# Patient Record
Sex: Female | Born: 1980 | Race: White | Hispanic: Yes | Marital: Single | Smoking: Never smoker
Health system: Southern US, Community
[De-identification: ages and names within clinical notes are randomized; demographics above are authoritative.]

## PROBLEM LIST (undated history)

## (undated) ENCOUNTER — Inpatient Hospital Stay (HOSPITAL_COMMUNITY): Payer: Self-pay

## (undated) DIAGNOSIS — O24419 Gestational diabetes mellitus in pregnancy, unspecified control: Secondary | ICD-10-CM

## (undated) DIAGNOSIS — O021 Missed abortion: Secondary | ICD-10-CM

## (undated) DIAGNOSIS — D259 Leiomyoma of uterus, unspecified: Secondary | ICD-10-CM

## (undated) DIAGNOSIS — R51 Headache: Secondary | ICD-10-CM

## (undated) DIAGNOSIS — O3410 Maternal care for benign tumor of corpus uteri, unspecified trimester: Secondary | ICD-10-CM

## (undated) DIAGNOSIS — O368130 Decreased fetal movements, third trimester, not applicable or unspecified: Secondary | ICD-10-CM

## (undated) DIAGNOSIS — Z789 Other specified health status: Secondary | ICD-10-CM

## (undated) HISTORY — PX: LEEP: SHX91

## (undated) HISTORY — PX: MYOMECTOMY: SHX85

## (undated) HISTORY — PX: WISDOM TOOTH EXTRACTION: SHX21

---

## 2003-03-21 ENCOUNTER — Other Ambulatory Visit: Admission: RE | Admit: 2003-03-21 | Discharge: 2003-03-21 | Payer: Self-pay | Admitting: Obstetrics & Gynecology

## 2011-11-26 ENCOUNTER — Inpatient Hospital Stay (HOSPITAL_COMMUNITY)
Admission: AD | Admit: 2011-11-26 | Discharge: 2011-11-26 | Disposition: A | Discharge status: Home / Self Care | Payer: 59 | Source: Ambulatory Visit | Attending: Obstetrics and Gynecology | Admitting: Obstetrics and Gynecology

## 2011-11-26 ENCOUNTER — Encounter (HOSPITAL_COMMUNITY): Payer: Self-pay | Admitting: *Deleted

## 2011-11-26 DIAGNOSIS — D259 Leiomyoma of uterus, unspecified: Secondary | ICD-10-CM | POA: Diagnosis present

## 2011-11-26 DIAGNOSIS — O99891 Other specified diseases and conditions complicating pregnancy: Secondary | ICD-10-CM | POA: Insufficient documentation

## 2011-11-26 DIAGNOSIS — R109 Unspecified abdominal pain: Secondary | ICD-10-CM | POA: Diagnosis present

## 2011-11-26 HISTORY — DX: Leiomyoma of uterus, unspecified: O34.10

## 2011-11-26 HISTORY — DX: Maternal care for benign tumor of corpus uteri, unspecified trimester: D25.9

## 2011-11-26 HISTORY — DX: Other specified health status: Z78.9

## 2011-11-26 LAB — CBC
HCT: 33.1 % — ABNORMAL LOW (ref 36.0–46.0)
Hemoglobin: 11 g/dL — ABNORMAL LOW (ref 12.0–15.0)
MCH: 30.8 pg (ref 26.0–34.0)
MCHC: 33.2 g/dL (ref 30.0–36.0)
MCV: 92.7 fL (ref 78.0–100.0)
Platelets: 329 10*3/uL (ref 150–400)
RBC: 3.57 MIL/uL — ABNORMAL LOW (ref 3.87–5.11)
RDW: 13.3 % (ref 11.5–15.5)
WBC: 19.3 10*3/uL — ABNORMAL HIGH (ref 4.0–10.5)

## 2011-11-26 LAB — COMPREHENSIVE METABOLIC PANEL
ALT: 28 U/L (ref 0–35)
AST: 16 U/L (ref 0–37)
Albumin: 2.6 g/dL — ABNORMAL LOW (ref 3.5–5.2)
Alkaline Phosphatase: 127 U/L — ABNORMAL HIGH (ref 39–117)
BUN: 8 mg/dL (ref 6–23)
CO2: 26 mEq/L (ref 19–32)
Calcium: 9.2 mg/dL (ref 8.4–10.5)
Chloride: 98 mEq/L (ref 96–112)
Creatinine, Ser: 0.65 mg/dL (ref 0.50–1.10)
GFR calc Af Amer: >90 mL/min (ref >90)
GFR calc non Af Amer: >90 mL/min (ref >90)
Glucose, Bld: 120 mg/dL — ABNORMAL HIGH (ref 70–99)
Potassium: 3.9 mEq/L (ref 3.5–5.1)
Sodium: 136 mEq/L (ref 135–145)
Total Bilirubin: 0.8 mg/dL (ref 0.3–1.2)
Total Protein: 6.6 g/dL (ref 6.0–8.3)

## 2011-11-26 LAB — URINE MICROSCOPIC-ADD ON

## 2011-11-26 LAB — URINALYSIS, ROUTINE W REFLEX MICROSCOPIC
Bilirubin Urine: NEGATIVE
Glucose, UA: NEGATIVE mg/dL
Ketones, ur: NEGATIVE mg/dL
Nitrite: NEGATIVE
Protein, ur: NEGATIVE mg/dL
Specific Gravity, Urine: 1.015 (ref 1.005–1.030)
Urobilinogen, UA: 0.2 mg/dL (ref 0.0–1.0)
pH: 6.5 (ref 5.0–8.0)

## 2011-11-26 LAB — DIFFERENTIAL
Basophils Absolute: 0 10*3/uL (ref 0.0–0.1)
Basophils Relative: 0 % (ref 0–1)
Eosinophils Absolute: 0 10*3/uL (ref 0.0–0.7)
Eosinophils Relative: 0 % (ref 0–5)
Lymphocytes Relative: 6 % — ABNORMAL LOW (ref 12–46)
Lymphs Abs: 1.2 10*3/uL (ref 0.7–4.0)
Monocytes Absolute: 1 10*3/uL (ref 0.1–1.0)
Monocytes Relative: 5 % (ref 3–12)
Neutro Abs: 17.2 10*3/uL — ABNORMAL HIGH (ref 1.7–7.7)
Neutrophils Relative %: 89 % — ABNORMAL HIGH (ref 43–77)

## 2011-11-26 MED ORDER — TRAMADOL HCL 50 MG PO TABS: 50.0000 mg | ORAL_TABLET | Freq: Three times a day (TID) | ORAL | Status: AC | PRN

## 2011-11-26 MED ORDER — NALBUPHINE SYRINGE 5 MG/0.5 ML
10.0000 mg | INJECTION | INTRAMUSCULAR | Status: AC
  Administered 2011-11-26: 10 mg via SUBCUTANEOUS
  Filled 2011-11-26: qty 1

## 2011-11-26 NOTE — MAU Note (Signed)
Pt reports having lower abd on right and left side sharp pain that is not relieved with tylenol. Hurts to take a deep breath.

## 2011-11-26 NOTE — MAU Note (Signed)
Applied K-pad to affected area.

## 2011-11-26 NOTE — Progress Notes (Signed)
  VS: 97.3 - 109 - 18 - 125/74  CBC: 19.3 - 11.0 - 33.1- 329  CMP: SGOT 16 / SGPT 28 / glucose 120 / creatinine 0.65 / K 3.9  Urinalysis : 1015 spec gravity  / (+) leukocytes /  (-) nitrites / many bacteria - few epithelial  Pain improved with Nubain and k-pad to site. Tolerating aggressive oral hydration without nausea. Sitting up in bed eating sandwich - ready to go back home   A/P:  Abdominal pain likely related to degenerating fibroid Discussed fibroid pain may recur throughout pregnancy - pregnancy hormones contribute to fibroid growth Will monitor fibroid growth with sono in second and third trimester  Management plan:  Maintain adequate water hydration - instructed this is the single most important  Intervention to reduce fibroid pain in pregnancy Heating pad on low-moderate heat setting to fibroid location for 15-20 minutes each hour ok for comfort  Ok to continue tylenol as needed for pain Ultram for severe pain every 8 hours as needed - only take for severe pain & sparingly  Keep apt at [employer] on Monday

## 2011-11-26 NOTE — Discharge Instructions (Signed)
WATER hydration and fluids - hourly intake of oral fluids DAILY  ( to assess adequate oral hydration - maintain urine color as clear) Heat pad to right lower quadrant 15-20 minutes every hour as needed for comfort  Tylenol 2 tablets every 4 hours as needed for pain Ultram 50 mg every 8 hours for severe pain no relieved with Tylenol

## 2011-11-26 NOTE — MAU Provider Note (Signed)
  History    CSN: [id number]  Patient arrival date and time: 3/23/13at 1311  Nurse called provider: 11/26/2011 at 1354 per phone records  Provider arrived to see patient: 3/23/22013 at 1320    Chief Complaint  Patient presents with  . Abdominal Pain   HPI Comments: Intermittent abdominal pain x 2 weeks worsening in past 12 hours. Pain intense today - hurst so bad to move and breathe - tylenol ineffective for pain control.  Seen at [facility] this past week for same complaint / sono - IUP with FHR / degrenerating right pedunculated fibroid approximately 6cm size.  No bleeding or vaginal discharge.  + nausea with occasional vomiting in pregnancy / + nausea today / no vomiting. Normal bowel movement this am / normal appetite. Some constipation intermittently since pregnant. No fever or chills.  No urinary symptoms - no back or flank pain.    Past Medical History  Diagnosis Date  . Uterine fibroids affecting pregnancy   . No pertinent past medical history    Past Surgical History  Procedure Date  . Leep   . No past surgeries    No family history on file.  History  Substance Use Topics  . Smoking status: Never Smoker   . Smokeless tobacco: Not on file  . Alcohol Use: No   Allergies: No Known Allergies  Prescriptions prior to admission  Medication Sig Dispense Refill  . acetaminophen (TYLENOL) 325 MG tablet Take 650 mg by mouth every 6 (six) hours as needed. For pain      . Prenatal Vit-Fe Fumarate-FA (PRENATAL MULTIVITAMIN) TABS Take 1 tablet by mouth daily.      Last medication dose - 0800 this am  Review of Systems  Constitutional: Positive for malaise/fatigue. Negative for fever and chills.  Respiratory: Negative.   Cardiovascular: Negative.   Gastrointestinal: Positive for nausea, abdominal pain and constipation. Negative for diarrhea.  Genitourinary: Negative for dysuria, urgency, frequency, hematuria and flank pain.  Musculoskeletal: Negative.   Neurological:  Negative.  Negative for headaches.   Physical Exam  Vital Signs Blood pressure 125/74, pulse 109, temperature 97.3 F (36.3 C), temperature source Oral, resp. rate 18, height 5\' 7"  (1.702 m), weight 84.097 kg (185 lb 6.4 oz), last menstrual period 09/27/2011.  Physical Exam  Constitutional: She appears well-developed and well-nourished. She appears distressed.  Neck: Normal range of motion.  Cardiovascular: Normal rate and regular rhythm.   Respiratory: Effort normal and breath sounds normal.  GI: Soft. Bowel sounds are normal. She exhibits no distension. There is tenderness in the right lower quadrant and suprapubic area. There is no rigidity, no rebound, no guarding, no CVA tenderness and no tenderness at McBurney's point.  Genitourinary: Vagina normal. There is no rash, tenderness or lesion on the right labia. There is no rash, tenderness or lesion on the left labia.  Bimanual exam : no vaginal bleeding or discharge / no external lesions / no bladder or suprapubic tenderness / uterus slightly enlarged with mild tenderness / negative CMT / left adnexa normal / right adnexal region exquisitely tender on exam with firm mass~ 4-5 cm size and mobile.  MAU Course  Procedures - none  Assessment and Plan  7 week IUP (documented with SONO this week) Abdominal pain -likely related to large pedunculated, degenerating fibroid  1) Assess CBCD / CMP 2) aggressive oral hydration in next hour  3) pain management : k-pad to fibroid site / sub-q Nubain 10 mg now  [name] 11/26/2011, 2:45 PM

## 2011-12-05 DIAGNOSIS — O021 Missed abortion: Secondary | ICD-10-CM

## 2011-12-05 HISTORY — DX: Missed abortion: O02.1

## 2012-04-04 ENCOUNTER — Encounter (HOSPITAL_COMMUNITY): Payer: Self-pay | Admitting: Pharmacist

## 2012-04-09 ENCOUNTER — Other Ambulatory Visit: Payer: Self-pay | Admitting: Obstetrics & Gynecology

## 2012-04-11 ENCOUNTER — Encounter (HOSPITAL_COMMUNITY): Payer: Self-pay

## 2012-04-11 ENCOUNTER — Other Ambulatory Visit (HOSPITAL_COMMUNITY): Payer: 59

## 2012-04-11 ENCOUNTER — Encounter (HOSPITAL_COMMUNITY)
Admission: RE | Admit: 2012-04-11 | Discharge: 2012-04-11 | Disposition: A | Discharge status: Home / Self Care | Payer: 59 | Source: Ambulatory Visit | Attending: Obstetrics & Gynecology | Admitting: Obstetrics & Gynecology

## 2012-04-11 LAB — CBC
HCT: 43.3 % (ref 36.0–46.0)
MCH: 30.5 pg (ref 26.0–34.0)
MCHC: 33.7 g/dL (ref 30.0–36.0)
MCV: 90.6 fL (ref 78.0–100.0)
RDW: 14 % (ref 11.5–15.5)
WBC: 8.3 10*3/uL (ref 4.0–10.5)

## 2012-04-11 NOTE — Patient Instructions (Addendum)
20 Kimberly Campbell  04/11/2012   Your procedure is scheduled on:  04/16/12  Enter through the Main Entrance of [facility] at 1130 AM.  Pick up the phone at the desk and dial [phone].   Call this number if you have problems the morning of surgery: [phone]   Remember:   Do not eat food:after midnight the evening before surgery   (follow bowel prep instructions from MD).  Do not drink clear liquids: 4 Hours before arrival.  Take these medicines the morning of surgery with A SIP OF WATER: NA   Do not wear jewelry, make-up or nail polish.  Do not wear lotions, powders, or perfumes. You may wear deodorant.  Do not shave 48 hours prior to surgery.  Do not bring valuables to the hospital.  Contacts, dentures or bridgework may not be worn into surgery.  Leave suitcase in the car. After surgery it may be brought to your room.  For patients admitted to the hospital, checkout time is 11:00 AM the day of discharge.   Patients discharged the day of surgery will not be allowed to drive home.  Name and phone number of your driver: NA  Special Instructions: CHG Shower Use Special Wash: 1/2 bottle night before surgery and 1/2 bottle morning of surgery.   Please read over the following fact sheets that you were given: MRSA Information

## 2012-04-15 MED ORDER — DEXTROSE 5 % IV SOLN
2.0000 g | Freq: Once | INTRAVENOUS | Status: DC
  Filled 2012-04-15: qty 2

## 2012-04-16 ENCOUNTER — Encounter (HOSPITAL_COMMUNITY)
Admission: RE | Disposition: A | Discharge status: Home / Self Care | Payer: Self-pay | Source: Ambulatory Visit | Attending: Obstetrics & Gynecology

## 2012-04-16 ENCOUNTER — Encounter (HOSPITAL_COMMUNITY): Payer: Self-pay | Admitting: *Deleted

## 2012-04-16 ENCOUNTER — Ambulatory Visit (HOSPITAL_COMMUNITY)
Admission: RE | Admit: 2012-04-16 | Discharge: 2012-04-16 | Disposition: A | Discharge status: Home / Self Care | Payer: 59 | Source: Ambulatory Visit | Attending: Obstetrics & Gynecology | Admitting: Obstetrics & Gynecology

## 2012-04-16 DIAGNOSIS — Z01812 Encounter for preprocedural laboratory examination: Secondary | ICD-10-CM | POA: Insufficient documentation

## 2012-04-16 DIAGNOSIS — Z01818 Encounter for other preprocedural examination: Secondary | ICD-10-CM | POA: Insufficient documentation

## 2012-04-16 DIAGNOSIS — N736 Female pelvic peritoneal adhesions (postinfective): Secondary | ICD-10-CM | POA: Insufficient documentation

## 2012-04-16 DIAGNOSIS — D251 Intramural leiomyoma of uterus: Secondary | ICD-10-CM | POA: Insufficient documentation

## 2012-04-16 HISTORY — DX: Missed abortion: O02.1

## 2012-04-16 HISTORY — DX: Headache: R51

## 2012-04-16 LAB — TYPE AND SCREEN: ABO/RH(D): A POS

## 2012-04-16 LAB — ABO/RH: ABO/RH(D): A POS

## 2012-04-16 SURGERY — ROBOTIC ASSISTED MYOMECTOMY
Anesthesia: General

## 2012-04-16 MED ORDER — LACTATED RINGERS IV SOLN: INTRAVENOUS | Status: DC

## 2012-04-16 SURGICAL SUPPLY — 55 items
BARRIER ADHS 3X4 INTERCEED (GAUZE/BANDAGES/DRESSINGS) IMPLANT
BLADE LAP MORCELLATOR 15X9.5 (ELECTROSURGICAL) IMPLANT
CLOTH BEACON ORANGE TIMEOUT ST (SAFETY) IMPLANT
DECANTER SPIKE VIAL GLASS SM (MISCELLANEOUS) IMPLANT
DERMABOND ADVANCED (GAUZE/BANDAGES/DRESSINGS)
DERMABOND ADVANCED .7 DNX12 (GAUZE/BANDAGES/DRESSINGS) IMPLANT
DRAPE HUG U DISPOSABLE (DRAPE) IMPLANT
DRAPE LG THREE QUARTER DISP (DRAPES) IMPLANT
DRAPE MONITOR DA VINCI (DRAPE) IMPLANT
DRAPE WARM FLUID 44X44 (DRAPE) IMPLANT
ELECT REM PT RETURN 9FT ADLT (ELECTROSURGICAL)
ELECTRODE REM PT RTRN 9FT ADLT (ELECTROSURGICAL) IMPLANT
EVACUATOR SMOKE 8.L (FILTER) IMPLANT
GAUZE VASELINE 3X9 (GAUZE/BANDAGES/DRESSINGS) IMPLANT
GLOVE BIO SURGEON STRL SZ 6.5 (GLOVE) ×2 IMPLANT
GLOVE BIOGEL PI IND STRL 7.0 (GLOVE) ×1 IMPLANT
GLOVE BIOGEL PI INDICATOR 7.0 (GLOVE) ×1
GLOVE ECLIPSE 6.5 STRL STRAW (GLOVE) IMPLANT
GOWN STRL REIN XL XLG (GOWN DISPOSABLE) IMPLANT
IV SET ADMIN PUMP GEMINI W/NLD (IV SETS) IMPLANT
IV STOPCOCK 4 WAY 40  W/Y SET (IV SOLUTION)
IV STOPCOCK 4 WAY 40 W/Y SET (IV SOLUTION) IMPLANT
KIT ACCESSORY DA VINCI DISP (KITS)
KIT ACCESSORY DVNC DISP (KITS) IMPLANT
KIT DISP ACCESSORY 4 ARM (KITS) IMPLANT
NEEDLE HYPO 22GX1.5 SAFETY (NEEDLE) IMPLANT
OCCLUDER COLPOPNEUMO (BALLOONS) IMPLANT
PACK LAVH (CUSTOM PROCEDURE TRAY) IMPLANT
SET CYSTO W/LG BORE CLAMP LF (SET/KITS/TRAYS/PACK) IMPLANT
SET IRRIG TUBING LAPAROSCOPIC (IRRIGATION / IRRIGATOR) IMPLANT
SOLUTION ELECTROLUBE (MISCELLANEOUS) IMPLANT
SPONGE LAP 18X18 X RAY DECT (DISPOSABLE) IMPLANT
SUT VIC AB 0 CT1 27 (SUTURE)
SUT VIC AB 0 CT1 27XBRD ANTBC (SUTURE) IMPLANT
SUT VIC AB 4-0 PS2 27 (SUTURE) IMPLANT
SUT VICRYL 0 UR6 27IN ABS (SUTURE) IMPLANT
SUT VLOC 180 0 9IN  GS21 (SUTURE)
SUT VLOC 180 0 9IN GS21 (SUTURE) IMPLANT
SUT VLOC 180 2-0 9IN GS21 (SUTURE) IMPLANT
SYR 50ML LL SCALE MARK (SYRINGE) IMPLANT
SYSTEM CONVERTIBLE TROCAR (TROCAR) IMPLANT
TIP UTERINE 5.1X6CM LAV DISP (MISCELLANEOUS) IMPLANT
TIP UTERINE 6.7X10CM GRN DISP (MISCELLANEOUS) IMPLANT
TIP UTERINE 6.7X6CM WHT DISP (MISCELLANEOUS) IMPLANT
TIP UTERINE 6.7X8CM BLUE DISP (MISCELLANEOUS) IMPLANT
TOWEL OR 17X24 6PK STRL BLUE (TOWEL DISPOSABLE) IMPLANT
TRAY FOLEY BAG SILVER LF 14FR (CATHETERS) IMPLANT
TROCAR 12M 150ML BLUNT (TROCAR) IMPLANT
TROCAR DISP BLADELESS 8 DVNC (TROCAR) IMPLANT
TROCAR DISP BLADELESS 8MM (TROCAR)
TROCAR XCEL 12X100 BLDLESS (ENDOMECHANICALS) IMPLANT
TROCAR XCEL NON-BLD 5MMX100MML (ENDOMECHANICALS) IMPLANT
TROCAR Z-THREAD BLADED 12X100M (TROCAR) IMPLANT
TUBING FILTER THERMOFLATOR (ELECTROSURGICAL) IMPLANT
WATER STERILE IRR 1000ML POUR (IV SOLUTION) IMPLANT

## 2012-04-16 NOTE — Progress Notes (Signed)
Dr Seymour Bars Cancelled case

## 2012-04-16 NOTE — Progress Notes (Signed)
Recvd call from Ledon Snare, RN - OR Desk that there is going to be a room delay until 3pm.  Dr Seymour Bars was informed by Waynetta Sandy.  Patient informed.  Beth called back and said Dr Seymour Bars is coming to speak to the patient in SS.  Patient's surgery was scheduled for 1pm today.

## 2012-04-17 ENCOUNTER — Encounter (HOSPITAL_COMMUNITY): Payer: Self-pay | Admitting: Obstetrics and Gynecology

## 2012-04-17 ENCOUNTER — Encounter (HOSPITAL_COMMUNITY): Payer: Self-pay | Admitting: Anesthesiology

## 2012-04-17 ENCOUNTER — Encounter (HOSPITAL_COMMUNITY): Payer: Self-pay | Admitting: Obstetrics & Gynecology

## 2012-04-17 ENCOUNTER — Encounter (HOSPITAL_COMMUNITY)
Admission: RE | Disposition: A | Discharge status: Home / Self Care | Payer: Self-pay | Source: Ambulatory Visit | Attending: Obstetrics & Gynecology

## 2012-04-17 ENCOUNTER — Ambulatory Visit (HOSPITAL_COMMUNITY): Payer: 59 | Admitting: Anesthesiology

## 2012-04-17 ENCOUNTER — Ambulatory Visit (HOSPITAL_COMMUNITY)
Admission: RE | Admit: 2012-04-17 | Discharge: 2012-04-18 | Disposition: A | Discharge status: Home / Self Care | Payer: 59 | Source: Ambulatory Visit | Attending: Obstetrics & Gynecology | Admitting: Obstetrics & Gynecology

## 2012-04-17 DIAGNOSIS — D252 Subserosal leiomyoma of uterus: Secondary | ICD-10-CM | POA: Insufficient documentation

## 2012-04-17 DIAGNOSIS — Z01812 Encounter for preprocedural laboratory examination: Secondary | ICD-10-CM | POA: Insufficient documentation

## 2012-04-17 DIAGNOSIS — Z01818 Encounter for other preprocedural examination: Secondary | ICD-10-CM | POA: Insufficient documentation

## 2012-04-17 DIAGNOSIS — D251 Intramural leiomyoma of uterus: Secondary | ICD-10-CM | POA: Insufficient documentation

## 2012-04-17 DIAGNOSIS — N949 Unspecified condition associated with female genital organs and menstrual cycle: Secondary | ICD-10-CM | POA: Insufficient documentation

## 2012-04-17 DIAGNOSIS — N736 Female pelvic peritoneal adhesions (postinfective): Secondary | ICD-10-CM | POA: Insufficient documentation

## 2012-04-17 HISTORY — PX: ROBOT ASSISTED MYOMECTOMY: SHX5142

## 2012-04-17 SURGERY — ROBOTIC ASSISTED MYOMECTOMY
Anesthesia: General | Site: Abdomen | Wound class: Clean Contaminated

## 2012-04-17 MED ORDER — BUPIVACAINE HCL (PF) 0.25 % IJ SOLN
INTRAMUSCULAR | Status: AC
  Filled 2012-04-17: qty 30

## 2012-04-17 MED ORDER — IBUPROFEN 600 MG PO TABS
600.0000 mg | ORAL_TABLET | Freq: Four times a day (QID) | ORAL | Status: DC | PRN
  Administered 2012-04-18: 600 mg via ORAL
  Filled 2012-04-17: qty 1

## 2012-04-17 MED ORDER — ROCURONIUM BROMIDE 100 MG/10ML IV SOLN
INTRAVENOUS | Status: DC | PRN
  Administered 2012-04-17: 5 mg via INTRAVENOUS
  Administered 2012-04-17 (×2): 10 mg via INTRAVENOUS
  Administered 2012-04-17: 50 mg via INTRAVENOUS

## 2012-04-17 MED ORDER — GLYCOPYRROLATE 0.2 MG/ML IJ SOLN
INTRAMUSCULAR | Status: DC | PRN
  Administered 2012-04-17: .8 mg via INTRAVENOUS

## 2012-04-17 MED ORDER — MIDAZOLAM HCL 5 MG/5ML IJ SOLN
INTRAMUSCULAR | Status: DC | PRN
  Administered 2012-04-17: 2 mg via INTRAVENOUS

## 2012-04-17 MED ORDER — DEXAMETHASONE SODIUM PHOSPHATE 4 MG/ML IJ SOLN
INTRAMUSCULAR | Status: DC | PRN
  Administered 2012-04-17: 10 mg via INTRAVENOUS

## 2012-04-17 MED ORDER — ONDANSETRON HCL 4 MG/2ML IJ SOLN
INTRAMUSCULAR | Status: AC
  Filled 2012-04-17: qty 2

## 2012-04-17 MED ORDER — ROCURONIUM BROMIDE 50 MG/5ML IV SOLN
INTRAVENOUS | Status: AC
  Filled 2012-04-17: qty 2

## 2012-04-17 MED ORDER — LIDOCAINE HCL (CARDIAC) 20 MG/ML IV SOLN
INTRAVENOUS | Status: DC | PRN
  Administered 2012-04-17: 80 mg via INTRAVENOUS

## 2012-04-17 MED ORDER — PROPOFOL 10 MG/ML IV EMUL
INTRAVENOUS | Status: DC | PRN
  Administered 2012-04-17: 200 mg via INTRAVENOUS

## 2012-04-17 MED ORDER — VASOPRESSIN 20 UNIT/ML IJ SOLN
INTRAMUSCULAR | Status: AC
  Filled 2012-04-17: qty 2

## 2012-04-17 MED ORDER — BUPIVACAINE HCL (PF) 0.25 % IJ SOLN
INTRAMUSCULAR | Status: DC | PRN
  Administered 2012-04-17: 8 mL
  Administered 2012-04-17: 14 mL

## 2012-04-17 MED ORDER — DEXAMETHASONE SODIUM PHOSPHATE 10 MG/ML IJ SOLN
INTRAMUSCULAR | Status: AC
  Filled 2012-04-17: qty 1

## 2012-04-17 MED ORDER — FENTANYL CITRATE 0.05 MG/ML IJ SOLN
INTRAMUSCULAR | Status: DC | PRN
  Administered 2012-04-17: 75 ug via INTRAVENOUS
  Administered 2012-04-17: 50 ug via INTRAVENOUS
  Administered 2012-04-17: 25 ug via INTRAVENOUS
  Administered 2012-04-17: 100 ug via INTRAVENOUS
  Administered 2012-04-17 (×2): 50 ug via INTRAVENOUS

## 2012-04-17 MED ORDER — HYDROMORPHONE HCL PF 1 MG/ML IJ SOLN: 1.0000 mg | INTRAMUSCULAR | Status: DC | PRN

## 2012-04-17 MED ORDER — LACTATED RINGERS IV SOLN: INTRAVENOUS | Status: DC

## 2012-04-17 MED ORDER — OXYCODONE-ACETAMINOPHEN 5-325 MG PO TABS: 1.0000 | ORAL_TABLET | ORAL | Status: DC | PRN

## 2012-04-17 MED ORDER — NEOSTIGMINE METHYLSULFATE 1 MG/ML IJ SOLN
INTRAMUSCULAR | Status: AC
  Filled 2012-04-17: qty 10

## 2012-04-17 MED ORDER — ONDANSETRON HCL 4 MG/2ML IJ SOLN
INTRAMUSCULAR | Status: DC | PRN
  Administered 2012-04-17: 4 mg via INTRAVENOUS

## 2012-04-17 MED ORDER — LIDOCAINE HCL (CARDIAC) 20 MG/ML IV SOLN
INTRAVENOUS | Status: AC
  Filled 2012-04-17: qty 5

## 2012-04-17 MED ORDER — GLYCOPYRROLATE 0.2 MG/ML IJ SOLN
INTRAMUSCULAR | Status: AC
  Filled 2012-04-17: qty 2

## 2012-04-17 MED ORDER — ARTIFICIAL TEARS OP OINT
TOPICAL_OINTMENT | OPHTHALMIC | Status: AC
  Filled 2012-04-17: qty 3.5

## 2012-04-17 MED ORDER — LACTATED RINGERS IV SOLN
INTRAVENOUS | Status: DC
  Administered 2012-04-17: 15:00:00 via INTRAVENOUS

## 2012-04-17 MED ORDER — DEXTROSE 5 % IV SOLN
2.0000 g | Freq: Once | INTRAVENOUS | Status: AC
  Administered 2012-04-17: 2 g via INTRAVENOUS
  Filled 2012-04-17: qty 2

## 2012-04-17 MED ORDER — HYDROMORPHONE HCL PF 1 MG/ML IJ SOLN
0.2500 mg | INTRAMUSCULAR | Status: DC | PRN
  Administered 2012-04-17: 0.5 mg via INTRAVENOUS

## 2012-04-17 MED ORDER — HYDROMORPHONE HCL PF 1 MG/ML IJ SOLN
INTRAMUSCULAR | Status: AC
  Administered 2012-04-17: 0.5 mg via INTRAVENOUS
  Filled 2012-04-17: qty 1

## 2012-04-17 MED ORDER — MIDAZOLAM HCL 2 MG/2ML IJ SOLN
INTRAMUSCULAR | Status: AC
  Filled 2012-04-17: qty 2

## 2012-04-17 MED ORDER — NEOSTIGMINE METHYLSULFATE 1 MG/ML IJ SOLN
INTRAMUSCULAR | Status: DC | PRN
  Administered 2012-04-17: 4 mg via INTRAVENOUS

## 2012-04-17 MED ORDER — FENTANYL CITRATE 0.05 MG/ML IJ SOLN
INTRAMUSCULAR | Status: AC
  Filled 2012-04-17: qty 5

## 2012-04-17 MED ORDER — PROPOFOL 10 MG/ML IV EMUL
INTRAVENOUS | Status: AC
  Filled 2012-04-17: qty 20

## 2012-04-17 MED ORDER — FENTANYL CITRATE 0.05 MG/ML IJ SOLN
INTRAMUSCULAR | Status: AC
  Filled 2012-04-17: qty 2

## 2012-04-17 SURGICAL SUPPLY — 56 items
BARRIER ADHS 3X4 INTERCEED (GAUZE/BANDAGES/DRESSINGS) ×2 IMPLANT
BLADE LAP MORCELLATOR 15X9.5 (ELECTROSURGICAL) IMPLANT
CLOTH BEACON ORANGE TIMEOUT ST (SAFETY) ×2 IMPLANT
DECANTER SPIKE VIAL GLASS SM (MISCELLANEOUS) ×2 IMPLANT
DERMABOND ADVANCED (GAUZE/BANDAGES/DRESSINGS) ×1
DERMABOND ADVANCED .7 DNX12 (GAUZE/BANDAGES/DRESSINGS) ×1 IMPLANT
DRAPE HUG U DISPOSABLE (DRAPE) ×2 IMPLANT
DRAPE LG THREE QUARTER DISP (DRAPES) ×4 IMPLANT
DRAPE MONITOR DA VINCI (DRAPE) IMPLANT
DRAPE WARM FLUID 44X44 (DRAPE) ×2 IMPLANT
ELECT REM PT RETURN 9FT ADLT (ELECTROSURGICAL) ×2
ELECTRODE REM PT RTRN 9FT ADLT (ELECTROSURGICAL) ×1 IMPLANT
EVACUATOR SMOKE 8.L (FILTER) ×2 IMPLANT
GAUZE VASELINE 3X9 (GAUZE/BANDAGES/DRESSINGS) IMPLANT
GLOVE BIO SURGEON STRL SZ 6.5 (GLOVE) ×4 IMPLANT
GLOVE BIOGEL PI IND STRL 7.0 (GLOVE) ×1 IMPLANT
GLOVE BIOGEL PI INDICATOR 7.0 (GLOVE) ×1
GLOVE ECLIPSE 6.5 STRL STRAW (GLOVE) ×6 IMPLANT
GOWN STRL REIN XL XLG (GOWN DISPOSABLE) ×12 IMPLANT
IV SET ADMIN PUMP GEMINI W/NLD (IV SETS) IMPLANT
IV STOPCOCK 4 WAY 40  W/Y SET (IV SOLUTION) ×1
IV STOPCOCK 4 WAY 40 W/Y SET (IV SOLUTION) ×1 IMPLANT
KIT ACCESSORY DA VINCI DISP (KITS) ×1
KIT ACCESSORY DVNC DISP (KITS) ×1 IMPLANT
KIT DISP ACCESSORY 4 ARM (KITS) IMPLANT
NEEDLE HYPO 22GX1.5 SAFETY (NEEDLE) ×2 IMPLANT
OCCLUDER COLPOPNEUMO (BALLOONS) IMPLANT
PACK LAVH (CUSTOM PROCEDURE TRAY) ×2 IMPLANT
SET CYSTO W/LG BORE CLAMP LF (SET/KITS/TRAYS/PACK) IMPLANT
SET IRRIG TUBING LAPAROSCOPIC (IRRIGATION / IRRIGATOR) ×2 IMPLANT
SOLUTION ELECTROLUBE (MISCELLANEOUS) ×2 IMPLANT
SPONGE LAP 18X18 X RAY DECT (DISPOSABLE) IMPLANT
SUT VIC AB 0 CT1 27 (SUTURE)
SUT VIC AB 0 CT1 27XBRD ANTBC (SUTURE) IMPLANT
SUT VIC AB 4-0 PS2 27 (SUTURE) IMPLANT
SUT VICRYL 0 UR6 27IN ABS (SUTURE) ×4 IMPLANT
SUT VLOC 180 0 9IN  GS21 (SUTURE)
SUT VLOC 180 0 9IN GS21 (SUTURE) IMPLANT
SUT VLOC 180 2-0 6IN GS21 (SUTURE) ×2 IMPLANT
SUT VLOC 180 2-0 9IN GS21 (SUTURE) IMPLANT
SYR 50ML LL SCALE MARK (SYRINGE) ×2 IMPLANT
SYSTEM CONVERTIBLE TROCAR (TROCAR) ×2 IMPLANT
TIP UTERINE 5.1X6CM LAV DISP (MISCELLANEOUS) IMPLANT
TIP UTERINE 6.7X10CM GRN DISP (MISCELLANEOUS) IMPLANT
TIP UTERINE 6.7X6CM WHT DISP (MISCELLANEOUS) IMPLANT
TIP UTERINE 6.7X8CM BLUE DISP (MISCELLANEOUS) IMPLANT
TOWEL OR 17X24 6PK STRL BLUE (TOWEL DISPOSABLE) ×4 IMPLANT
TRAY FOLEY BAG SILVER LF 14FR (CATHETERS) ×2 IMPLANT
TROCAR 12M 150ML BLUNT (TROCAR) IMPLANT
TROCAR DISP BLADELESS 8 DVNC (TROCAR) ×1 IMPLANT
TROCAR DISP BLADELESS 8MM (TROCAR) ×1
TROCAR XCEL 12X100 BLDLESS (ENDOMECHANICALS) ×2 IMPLANT
TROCAR XCEL NON-BLD 5MMX100MML (ENDOMECHANICALS) ×2 IMPLANT
TROCAR Z-THREAD BLADED 12X100M (TROCAR) IMPLANT
TUBING FILTER THERMOFLATOR (ELECTROSURGICAL) ×2 IMPLANT
WATER STERILE IRR 1000ML POUR (IV SOLUTION) ×6 IMPLANT

## 2012-04-17 NOTE — Anesthesia Postprocedure Evaluation (Signed)
Anesthesia Post Note  Patient: Kimberly Campbell  Procedure(s) Performed: Procedure(s) (LRB): ROBOTIC ASSISTED MYOMECTOMY (N/A) LYSIS OF ADHESION (N/A)  Anesthesia type: GA  Patient location: PACU  Post pain: Pain level controlled  Post assessment: Post-op Vital signs reviewed  Last Vitals:  Filed Vitals:   04/17/12 2000  BP: 135/81  Pulse: 82  Temp:   Resp: 19    Post vital signs: Reviewed  Level of consciousness: sedated  Complications: No apparent anesthesia complications

## 2012-04-17 NOTE — Anesthesia Procedure Notes (Addendum)
Procedure Name: Intubation Date/Time: 04/17/2012 4:16 PM Performed by: Rosalia Hammers Pre-anesthesia Checklist: Patient identified, Patient being monitored, Emergency Drugs available, Timeout performed and Suction available Patient Re-evaluated:Patient Re-evaluated prior to inductionOxygen Delivery Method: Circle system utilized Preoxygenation: Pre-oxygenation with 100% oxygen Intubation Type: IV induction Ventilation: Mask ventilation without difficulty and Oral airway inserted - appropriate to patient size Laryngoscope Size: Mac and 3 Grade View: Grade II Tube type: Oral Tube size: 7.5 mm Number of attempts: 1 Airway Equipment and Method: Stylet Placement Confirmation: ETT inserted through vocal cords under direct vision,  breath sounds checked- equal and bilateral and positive ETCO2 Secured at: 19 cm Tube secured with: Tape Dental Injury: Teeth and Oropharynx as per pre-operative assessment

## 2012-04-17 NOTE — Transfer of Care (Signed)
Immediate Anesthesia Transfer of Care Note  Patient: Kimberly Campbell  Procedure(s) Performed: Procedure(s) (LRB): ROBOTIC ASSISTED MYOMECTOMY (N/A) LYSIS OF ADHESION (N/A)  Patient Location: PACU  Anesthesia Type: General  Level of Consciousness: awake  Airway & Oxygen Therapy: Patient Spontanous Breathing and Patient connected to nasal cannula oxygen  Post-op Assessment: Report given to PACU RN and Post -op Vital signs reviewed and stable  Post vital signs: stable  Complications: No apparent anesthesia complications

## 2012-04-17 NOTE — Anesthesia Preprocedure Evaluation (Signed)

## 2012-04-17 NOTE — Op Note (Signed)
04/17/2012  6:39 PM  PATIENT:  Kimberly Campbell  31 y.o. female  PRE-OPERATIVE DIAGNOSIS:  Uterine Myomas  POST-OPERATIVE DIAGNOSIS:  Uterine Myomas and severe pelvic adhesions  PROCEDURE:  Procedure(s): ROBOTIC ASSISTED MYOMECTOMY AND EXTENSIVE LYSIS OF ADHESION  SURGEON:  Surgeon(s): Genia Del, MD  ASSISTANTS: Arlan Organ   ANESTHESIA:   general  PROCEDURE:  Under general and anesthesia with endotracheal intubation.  The patient is in lithotomy position. She is prepped with ChloraPrep on the abdomen and with Betadine on the suprapubic, vulvar and vaginal areas.  She is draped as usual. The vaginal exam reveals an irregular uterus about 10 cm, no adnexal mass felt.  The weighted speculum is inserted in the vagina and the cervix is grasped with a tenaculum. The hysterometry is 8 cm. Dilation of the cervix up to Hegar #21 without difficulty.  A #8 roomy with a small Coe ring are inserted without difficulty. The tenaculum and weighted speculum were removed. The Foley is inserted in the bladder.  A supraumbilical incision is made with a scalpel over 1.2 cm after infiltrating with Marcaine.  The aponeurosis is opened with the Mayo scissors under direct vision. The peritoneum is opened bluntly with a finger. A pursestring stitch of Vicryl 0 is done on the aponeurosis. The Roseanne Reno is inserted under direct vision. A pneumoperitoneum is created with CO2. The camera is inserted at that level. Inspection of the abdominopelvic cavities reveal a normal abdomen, normal appendix.  The pelvic cavity on presents severe widespread adhesions. Fine adhesions are present throughout the anterior and posterior cul-de-sac.  Thicker adhesions are present around the necrotic pedunculated anterior right fibroid.  The omentum is attached to that fibroid and the fibroid is in turn attached to the right anterior pelvic wall.  Port placement are disposed in a Teacher, adult education. 2 robotic ports on the right, 1  robotic port on the lower left and the 5 mm assistant port on the upper left.  A small incision is done with a scalpel after infiltration of Marcaine and all ports are placed under direct vision.  The robot his docked from the right side. The Endo Shears scissor is inserted in the right hand, the PK in the left hand and the pro-grasp in the third arm.  We go to the we start the procedure with extensive lysis of adhesions. All the adhesions are lysed and the anterior and posterior cul-de-sac and also around the left and right adnexa a. After releasing all adhesions the anatomy is completely restored. Both tubes and both ovaries are normal to inspection. We then freed the thicker adhesions associated with the pedunculated fibroid.  A very small, <1 cm, subserosal myoma is present the and the midline at the lower posterior aspect of the uterus. That fibroid is easily removed using on the PK at the base.  It is removed in 2 pieces through the 5 mm trocar.  We then proceed with removal of the pedunculated fibroid. Given the wide base, we resect the serosa above the base of the fibroid with the point of the Endo Shears scissors.  Traction is done with the tenaculum used robotically in the third arm.  The serosa is closed with a V. LOC 2-0 in a running suture.  We then visualize an intramural myoma at the mid anterior aspect of the uterus. It measures about 2 cm in diameter. We infiltrate at that level with Pitressin in a concentration of 20 and 100.  10 cc were used. We then make  a vertical incision at that level with the point of the Endo Shears scissors.  The myoma as extracted with the tenaculum. We then closed that small incision with a V. Lock 0 in a running suture. Hemostasis is adequate. We irrigated and suctioned the abdominopelvic cavities abundantly. All robotic instruments are removed.  The robot is undocked. We then go to laparoscopy times.  Using an 8 mm camera, the needles are removed.  We then insert the  morcellator through the supraumbilical  Incision.  The smaller intramural myoma is removed from the cavity. We then morcellated the pedunculated myoma.  Irrigation and suction was conducted once more. And Interceed was applied on the anterior aspect of the uterus covering both incisions. Hemostasis was adequate at all levels. All instruments were removed. All trochars were removed under direct vision. The CO2 was evacuated. The pursestring stitch was attached at the supraumbilical incision. The Bovie was used to complete hemostasis on all incisions. A Vicryl 4-0 was used in a subcuticular stitch to close all incisions. Dermabond was added on all incisions the co-ring with the roomy were removed from the vagina. The Foley was removed as well. The patient was brought to recovery room in good and stable status.  ESTIMATED BLOOD LOSS:  100 cc   Intake/Output Summary (Last 24 hours) at 04/17/12 1839 Last data filed at 04/17/12 1715  Gross per 24 hour  Intake    500 ml  Output    100 ml  Net    400 ml     BLOOD ADMINISTERED:none   LOCAL MEDICATIONS USED:  MARCAINE     SPECIMEN:  Source of Specimen:  Uterine myomas (3)  DISPOSITION OF SPECIMEN:  PATHOLOGY  COUNTS:  YES  PLAN OF CARE: Transfer to PACU    Genia Del MD   04/17/2012 at 6:40 PM

## 2012-04-17 NOTE — H&P (Signed)
Kimberly Campbell is an 31 y.o. 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  Had to be on Narcotics.  Menstrual History:  Patient's last menstrual period was 03/30/2012.    Past Medical History  Diagnosis Date  . Uterine fibroids affecting pregnancy   . No pertinent past medical history   . Headache     last one - 05/2011  . Missed abortion 12/2011    no surgery required    Past Surgical History  Procedure Date  . Leep   . No past surgeries   . Wisdom tooth extraction   . Robot assisted myomectomy 04/16/2012    Procedure: ROBOTIC ASSISTED MYOMECTOMY;  Surgeon: Genia Del, MD;  Location: [facility];  Service: Gynecology;  Laterality: N/A;  3 hrs.  Canceled 04/16/2012 and rescheduled today   No family history on file.  Social History:  reports that she has never smoked. She has never used smokeless tobacco. She reports that she drinks alcohol. She reports that she does not use illicit drugs.  Allergies: No Known Allergies  Prescriptions prior to admission  Medication Sig Dispense Refill  . acetaminophen (TYLENOL) 325 MG tablet Take 325 mg by mouth every 6 (six) hours as needed. For pain         Blood pressure 132/91, pulse 92, temperature 99 F (37.2 C), temperature source Oral, resp. rate 15, last menstrual period 03/30/2012, SpO2 96.00%.  [place]  Necrotic pedunculated 6.8 cm myoma   No results found for this or any previous visit (from the past 24 hour(s)).  No results found.  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  Surgery and risks reviewed.  Kimberly Campbell,Kimberly Campbell 04/17/2012, 3:38 PM

## 2012-04-18 ENCOUNTER — Encounter (HOSPITAL_COMMUNITY): Payer: Self-pay | Admitting: Obstetrics & Gynecology

## 2012-04-18 LAB — CBC
MCH: 30.7 pg (ref 26.0–34.0)
MCHC: 33.8 g/dL (ref 30.0–36.0)
MCV: 90.9 fL (ref 78.0–100.0)
Platelets: 195 10*3/uL (ref 150–400)
RBC: 4.27 MIL/uL (ref 3.87–5.11)
RDW: 13.5 % (ref 11.5–15.5)

## 2012-04-18 MED ORDER — OXYCODONE-ACETAMINOPHEN 7.5-325 MG PO TABS: 1.0000 | ORAL_TABLET | ORAL | Status: AC | PRN

## 2012-04-18 NOTE — Anesthesia Postprocedure Evaluation (Signed)
  Anesthesia Post-op Note  Patient: Kimberly Campbell  Procedure(s) Performed: Procedure(s) (LRB): ROBOTIC ASSISTED MYOMECTOMY (N/A) LYSIS OF ADHESION (N/A)  Patient Location: Women[facility] Unit  Anesthesia Type: General  Level of Consciousness: awake, alert  and oriented  Airway and Oxygen Therapy: Patient Spontanous Breathing  Post-op Pain: none  Post-op Assessment: Post-op Vital signs reviewed and Patient's Cardiovascular Status Stable  Post-op Vital Signs: Reviewed and stable  Complications: No apparent anesthesia complications

## 2012-04-18 NOTE — Progress Notes (Signed)
1 Day Post-Op Procedure(s) (LRB): ROBOTIC ASSISTED MYOMECTOMY (N/A) LYSIS OF ADHESION (N/A)  Subjective: Patient reports that pain is well managed.  Tolerating normal diet as tolerated  diet without difficulty. No nausea / vomiting.  Ambulating and voiding.  Objective: BP 119/73  Pulse 88  Temp 98.1 F (36.7 C) (Oral)  Resp 18  Ht 5\' 7"  (1.702 m)  Wt 84.823 kg (187 lb)  BMI 29.29 kg/m2  SpO2 95%  LMP 03/30/2012 Lungs: clear Heart: normal rate and rhythm Abdomen:soft and appropriately tender Extremities: Homans sign is negative, no sign of DVT Incision: healing well  Assessment: s/p Procedure(s): ROBOTIC ASSISTED MYOMECTOMY LYSIS OF ADHESION: progressing well  Plan: Discharge home  LOS: 1 day    Kimberly Campbell,MARIE-LYNE 04/18/2012, 1:30 PM

## 2012-04-18 NOTE — Discharge Summary (Signed)
  Physician Discharge Summary  Patient ID: Kimberly Campbell MRN: [id number] DOB/AGE: [birth year] 30 y.o.  Admit date: 04/17/2012 Discharge date: 04/18/2012  Admission Diagnoses: uterine myomas  Discharge Diagnoses: uterine myomas        Active Problems:  * No active hospital problems. *    Discharged Condition: good  Hospital Course:   Consults: None  Treatments: surgery: Robotic myomectomies and lysis of adhesions  Disposition: 01-Home or Self Care   Medication List  As of 04/18/2012  1:45 PM   ASK your doctor about these medications         acetaminophen 325 MG tablet   Commonly known as: TYLENOL   Take 325 mg by mouth every 6 (six) hours as needed. For pain           Follow-up Information    Follow up with Kimberly Campbell,MARIE-LYNE, MD in 3 weeks.   Contact information:   [address] [place] [address] [phone]          SignedGenia Del, MD 04/18/2012, 1:45 PM

## 2012-04-18 NOTE — Plan of Care (Signed)
Problem: Phase II Progression Outcomes Goal: Remove staples if indicated/incision care Outcome: Completed/Met Date Met:  04/18/12 5 lap sites with dermabond

## 2012-04-18 NOTE — Addendum Note (Signed)
Addendum  created 04/18/12 1403 by Shanon Payor, CRNA   Modules edited:Notes Section

## 2012-04-18 NOTE — Progress Notes (Signed)
Pt discharged to home. Ambulated out with family member.  Teaching complete.

## 2013-06-30 ENCOUNTER — Inpatient Hospital Stay (HOSPITAL_COMMUNITY)
Admission: AD | Admit: 2013-06-30 | Discharge: 2013-06-30 | Disposition: A | Discharge status: Home / Self Care | Payer: Managed Care, Other (non HMO) | Source: Ambulatory Visit | Attending: Obstetrics & Gynecology | Admitting: Obstetrics & Gynecology

## 2013-06-30 ENCOUNTER — Inpatient Hospital Stay (HOSPITAL_COMMUNITY): Payer: Managed Care, Other (non HMO)

## 2013-06-30 ENCOUNTER — Encounter (HOSPITAL_COMMUNITY): Payer: Self-pay | Admitting: *Deleted

## 2013-06-30 DIAGNOSIS — O200 Threatened abortion: Secondary | ICD-10-CM | POA: Insufficient documentation

## 2013-06-30 LAB — PROGESTERONE: Progesterone: 12.5 ng/mL

## 2013-06-30 LAB — URINALYSIS, ROUTINE W REFLEX MICROSCOPIC
Bilirubin Urine: NEGATIVE
Ketones, ur: NEGATIVE mg/dL
Nitrite: NEGATIVE
Protein, ur: NEGATIVE mg/dL
pH: 6 (ref 5.0–8.0)

## 2013-06-30 LAB — URINE MICROSCOPIC-ADD ON

## 2013-06-30 NOTE — MAU Provider Note (Signed)
History     CSN: [id number]  Arrival date and time: 06/30/13 0946 Nurse call to provider @ 1030  Provider in for exam @ 1145 after ultrasound    Chief Complaint  Patient presents with  . Vaginal Bleeding   HPI  Onset pink spotting this am - saw pink in toilet No recent intercourse Ultrasound in office confirmed IUP Hx SAB 7 weeks with large fibroids - myomectomy after pregnancy loss  Past Medical History  Diagnosis Date  . Uterine fibroids affecting pregnancy   . No pertinent past medical history   . Headache(784.0)     last one - 05/2011  . Missed abortion 12/2011    no surgery required    Past Surgical History  Procedure Laterality Date  . Leep    . No past surgeries    . Wisdom tooth extraction    . Robot assisted myomectomy  04/16/2012    Procedure: ROBOTIC ASSISTED MYOMECTOMY;  Surgeon: Genia Del, MD;  Location: [facility];  Service: Gynecology;  Laterality: N/A;  3 hrs.  . Robot assisted myomectomy  04/17/2012    Procedure: ROBOTIC ASSISTED MYOMECTOMY;  Surgeon: Genia Del, MD;  Location: [facility];  Service: Gynecology;  Laterality: N/A;    History reviewed. No pertinent family history.  History  Substance Use Topics  . Smoking status: Never Smoker   . Smokeless tobacco: Never Used  . Alcohol Use: Yes     Comment: rarely    Allergies: No Known Allergies  Prescriptions prior to admission  Medication Sig Dispense Refill  . Prenatal Vit-Fe Fumarate-FA (PRENATAL MULTIVITAMIN) TABS tablet Take 1 tablet by mouth daily at 12 noon.        ROS Physical Exam   Blood pressure 132/82, pulse 96, temperature 98.9 F (37.2 C), temperature source Oral, resp. rate 18, height 5\' 7"  (1.702 m), weight 90.776 kg (200 lb 2 oz), last menstrual period 05/10/2013.  Physical Exam NAD or pain / alert and oriented Lungs - clear Heart RRR Abdomen soft and non-tender Genitalia - scant pink on peripad Spec exam - small dark brown to pink blood in vaginal  vault Cervix appears long and closed - no active bleeding from os Uterus non-tender / adnexa without pain or masses  MAU Course  Procedures  ultrasound results:  CLINICAL DATA: Early pregnancy with vaginal bleeding. Last  menstrual period is approximately 05/10/2013.   OBSTETRIC <14 WK [place] AND TRANSVAGINAL OB US  TECHNIQUE: Both transabdominal and transvaginal ultrasound examinations were  performed for complete evaluation of the gestation as well as the maternal uterus, adnexal regions, and pelvic cul-de-sac. Transvaginal technique was performed to assess early pregnancy.   COMPARISON: None.  FINDINGS:  Intrauterine gestational sac: Single intrauterine gestational sac visualized.  Small adjacent subchorionic hemorrhage visualized.  Yolk sac: Normal yolk sac visualized.  Embryo: Fetal pole visualized.  Cardiac Activity: Regular cardiac activity detected.  Heart Rate: 129 bpm  MSD: mm w d  CRL: 9.5 mm 7 w 0 d [place] EDC: 02/16/2014  Maternal uterus/adnexae: Both ovaries are visualized and unremarkable in appearance.  No adnexal masses are identified. No free fluid is identified.   IMPRESSION:  Single viable intrauterine gestation identified by ultrasound with  estimated gestational age of [redacted] weeks and 0 days. Ultrasound EDC is  02/16/2014. A small adjacent subchorionic hemorrhage is identified.     Assessment and Plan  7.[redacted] weeks pregnant with [facility] Threatened miscarriage  1) Check progesterone level - consider Prometrium 2)  Reassured FHR - small  [facility] - implantation site bleed  3) Supportive care - rest at home x 24 hours without exercise / intercourse / strenuous activity                            - maintain good water hydration                            - call if bright red bleeding or pain 4) keep next office visit as scheduled  [name] CNM, MSN, [facility] 06/30/2013, 10:35 AM

## 2013-06-30 NOTE — MAU Note (Signed)
States office U/S confirmed living IUP.

## 2013-06-30 NOTE — MAU Note (Signed)
Pt states began bleeding this am, is wearing panty liner, and has changed pad x2. 2 weeks ago saw small clot, told Dr. Seymour Bars, then had u/s which was wnl. Denies pain at present

## 2013-06-30 NOTE — MAU Provider Note (Signed)
Reviewed and agree with note and plan. V.Lizzie An, MD  

## 2013-07-02 LAB — URINE CULTURE: Colony Count: 70000

## 2013-07-11 LAB — OB RESULTS CONSOLE HEPATITIS B SURFACE ANTIGEN: Hepatitis B Surface Ag: NEGATIVE

## 2013-07-11 LAB — OB RESULTS CONSOLE HIV ANTIBODY (ROUTINE TESTING): HIV: NONREACTIVE

## 2013-07-11 LAB — OB RESULTS CONSOLE RPR: RPR: NONREACTIVE

## 2013-07-11 LAB — OB RESULTS CONSOLE RUBELLA ANTIBODY, IGM: Rubella: IMMUNE

## 2013-10-25 ENCOUNTER — Other Ambulatory Visit: Payer: Self-pay | Admitting: Obstetrics & Gynecology

## 2013-10-25 ENCOUNTER — Ambulatory Visit (HOSPITAL_COMMUNITY)
Admission: RE | Admit: 2013-10-25 | Discharge: 2013-10-25 | Disposition: A | Discharge status: Home / Self Care | Payer: PRIVATE HEALTH INSURANCE | Source: Ambulatory Visit | Attending: Obstetrics & Gynecology | Admitting: Obstetrics & Gynecology

## 2013-10-25 ENCOUNTER — Other Ambulatory Visit (HOSPITAL_COMMUNITY): Payer: Self-pay | Admitting: Obstetrics & Gynecology

## 2013-10-25 DIAGNOSIS — O368190 Decreased fetal movements, unspecified trimester, not applicable or unspecified: Secondary | ICD-10-CM

## 2013-11-27 ENCOUNTER — Encounter: Payer: Managed Care, Other (non HMO) | Attending: Obstetrics & Gynecology

## 2014-01-14 ENCOUNTER — Other Ambulatory Visit: Payer: Self-pay | Admitting: Obstetrics & Gynecology

## 2014-01-26 ENCOUNTER — Inpatient Hospital Stay (HOSPITAL_COMMUNITY)
Admission: AD | Admit: 2014-01-26 | Discharge: 2014-01-26 | Disposition: A | Discharge status: Home / Self Care | Payer: PRIVATE HEALTH INSURANCE | Source: Ambulatory Visit | Attending: Obstetrics and Gynecology | Admitting: Obstetrics and Gynecology

## 2014-01-26 ENCOUNTER — Encounter (HOSPITAL_COMMUNITY): Payer: Self-pay | Admitting: *Deleted

## 2014-01-26 ENCOUNTER — Inpatient Hospital Stay (HOSPITAL_COMMUNITY): Payer: PRIVATE HEALTH INSURANCE

## 2014-01-26 ENCOUNTER — Inpatient Hospital Stay (HOSPITAL_COMMUNITY)
Admission: AD | Admit: 2014-01-26 | Payer: PRIVATE HEALTH INSURANCE | Source: Ambulatory Visit | Admitting: Obstetrics & Gynecology

## 2014-01-26 DIAGNOSIS — O99810 Abnormal glucose complicating pregnancy: Secondary | ICD-10-CM | POA: Insufficient documentation

## 2014-01-26 DIAGNOSIS — O368190 Decreased fetal movements, unspecified trimester, not applicable or unspecified: Secondary | ICD-10-CM | POA: Insufficient documentation

## 2014-01-26 DIAGNOSIS — O3490 Maternal care for abnormality of pelvic organ, unspecified, unspecified trimester: Secondary | ICD-10-CM | POA: Insufficient documentation

## 2014-01-26 HISTORY — DX: Gestational diabetes mellitus in pregnancy, unspecified control: O24.419

## 2014-01-26 NOTE — Progress Notes (Signed)
Ultrasound discussed with Dr. Lisbeth Renshaw regarding 6/8 BPP and 6/10 BPP/NST.  Preliminary report relayed to Dierdre Harness, RN, that delivery is recommended per Dr. Lisbeth Renshaw.

## 2014-01-26 NOTE — MAU Provider Note (Addendum)
History     Chief Complaint  Patient presents with  . Decreased Fetal Movement   33 yo G2P0010 female @37  2/[redacted] weeks gestation presents for evaluation of decreased FM since noon yesterday.  Pt was seen in ofice last Friday. PNC complicated by Class A2 GDM. Hx myomectomy with plans for C/S. Last ate @ 9-9:30 am. Last EFW 3 weeks ago 6 lb +  OB History   Grav Para Term Preterm Abortions TAB SAB Ect Mult Living   2    1  1    0      Past Medical History  Diagnosis Date  . Uterine fibroids affecting pregnancy   . No pertinent past medical history   . Headache(784.0)     last one - 05/2011  . Missed abortion 12/2011    no surgery required  . Gestational diabetes     Past Surgical History  Procedure Laterality Date  . Leep    . No past surgeries    . Wisdom tooth extraction    . Robot assisted myomectomy  04/16/2012    Procedure: ROBOTIC ASSISTED MYOMECTOMY;  Surgeon: Princess Bruins, MD;  Location: [facility];  Service: Gynecology;  Laterality: N/A;  3 hrs.  . Robot assisted myomectomy  04/17/2012    Procedure: ROBOTIC ASSISTED MYOMECTOMY;  Surgeon: Princess Bruins, MD;  Location: [facility];  Service: Gynecology;  Laterality: N/A;    History reviewed. No pertinent family history.  History  Substance Use Topics  . Smoking status: Never Smoker   . Smokeless tobacco: Never Used  . Alcohol Use: Yes     Comment: rarely    Allergies: No Known Allergies  Prescriptions prior to admission  Medication Sig Dispense Refill  . glyBURIDE (DIABETA) 2.5 MG tablet Take 2.5 mg by mouth 2 (two) times daily with a meal.      . nitrofurantoin, macrocrystal-monohydrate, (MACROBID) 100 MG capsule Take 100 mg by mouth 2 (two) times daily.      . Prenatal Vit-Fe Fumarate-FA (PRENATAL MULTIVITAMIN) TABS tablet Take 1 tablet by mouth daily at 12 noon.         Physical Exam   Blood pressure 133/85, pulse 94, temperature 98.1 F (36.7 C), resp. rate 18, last menstrual period 05/10/2013.  General  appearance: alert, cooperative and no distress Lungs: clear to auscultation bilaterally Heart: regular rate and rhythm, S1, S2 normal, no murmur, click, rub or gallop Abdomen: gravid nontender S>D Extremities: edema 1+  Tracing: baseline 140 (-) accel ED Course   Class A2 GDM NR NST IUP @ 37 2/7 weeks Hx myomectomy P) BPP, EFW  MDM   Marvene Staff, MD 2:13 PM 01/26/2014     4:34 PM Pt advised preliminary report: BPP 6/8. Pt and family informed recommendation is for delivery based on BPP 6/10. Pt stated she would prefer not to deliver today as she knows with her gestational diabetes that the baby my have problems with the lung. Pt informed that is also why she is being given clear liquid to try to see if nst improved however if not, she will need to be delivered. Pt asked if Dr Dellis Filbert would be the one doing the C/S. Informed no. Dr Pamala Hurry is coming on at 5 PM. Pt clarified: ate b/w 9:30 and  10 am. Will review tracing and reassess. Pt agrees with plan  Interval history. Pt had small po intake. Now feeling fetal movement. HUngry and would like to go home. NO abdominal pain, no ctx, no VB, no  LOF  PE: Filed Vitals:   01/26/14 1303  BP: 133/85  Pulse: 94  Temp: 98.1 F (36.7 C)  Resp: 18   Gen: well, no distress ABd: gravid, NT, no RUQ pain LE: 1+ edema GU: def Toco: not tracing 140sl + accels, no decels, 10 beat var  A/P: Decreased FM w/ not reactive testing. BPP 8/10 w/ reactive NST. Strict kick counts reviewed w/ pt. F./u in office for repeat BPP in 3 days.  Plans c/s for h/o myomectomy  Rosser Collington A. Ifeanyichukwu Wickham 01/26/2014 5:41 PM

## 2014-01-26 NOTE — MAU Note (Signed)
Pt presents to MAU with complaints of decreased fetal movement since yesterday. Denies any contractions of LOF

## 2014-01-26 NOTE — MAU Note (Signed)
33 yo, G2P0 at [redacted]w[redacted]d,  decreased FM since last night; denies VB, LOF, contractions. Pregnancy complicated by GDM, glyburide 2.5 BID.  Was seen on Friday and began [place] for UTI.

## 2014-01-27 ENCOUNTER — Encounter (HOSPITAL_COMMUNITY)
Admission: AD | Disposition: A | Discharge status: Home / Self Care | Payer: Self-pay | Source: Ambulatory Visit | Attending: Obstetrics

## 2014-01-27 ENCOUNTER — Encounter (HOSPITAL_COMMUNITY): Payer: Self-pay

## 2014-01-27 ENCOUNTER — Encounter (HOSPITAL_COMMUNITY): Payer: PRIVATE HEALTH INSURANCE | Admitting: Anesthesiology

## 2014-01-27 ENCOUNTER — Inpatient Hospital Stay (HOSPITAL_COMMUNITY): Payer: PRIVATE HEALTH INSURANCE | Admitting: Anesthesiology

## 2014-01-27 ENCOUNTER — Inpatient Hospital Stay (HOSPITAL_COMMUNITY)
Admission: AD | Admit: 2014-01-27 | Discharge: 2014-01-29 | DRG: 765 | Disposition: A | Discharge status: Home / Self Care | Payer: PRIVATE HEALTH INSURANCE | Source: Ambulatory Visit | Attending: Obstetrics and Gynecology | Admitting: Obstetrics and Gynecology

## 2014-01-27 DIAGNOSIS — Z6836 Body mass index (BMI) 36.0-36.9, adult: Secondary | ICD-10-CM

## 2014-01-27 DIAGNOSIS — O99214 Obesity complicating childbirth: Secondary | ICD-10-CM

## 2014-01-27 DIAGNOSIS — O3490 Maternal care for abnormality of pelvic organ, unspecified, unspecified trimester: Principal | ICD-10-CM | POA: Diagnosis present

## 2014-01-27 DIAGNOSIS — D4959 Neoplasm of unspecified behavior of other genitourinary organ: Secondary | ICD-10-CM | POA: Diagnosis present

## 2014-01-27 DIAGNOSIS — E669 Obesity, unspecified: Secondary | ICD-10-CM | POA: Diagnosis present

## 2014-01-27 DIAGNOSIS — O99814 Abnormal glucose complicating childbirth: Secondary | ICD-10-CM | POA: Diagnosis present

## 2014-01-27 DIAGNOSIS — D62 Acute posthemorrhagic anemia: Secondary | ICD-10-CM | POA: Diagnosis not present

## 2014-01-27 DIAGNOSIS — D259 Leiomyoma of uterus, unspecified: Secondary | ICD-10-CM | POA: Diagnosis present

## 2014-01-27 DIAGNOSIS — O34599 Maternal care for other abnormalities of gravid uterus, unspecified trimester: Secondary | ICD-10-CM | POA: Diagnosis present

## 2014-01-27 DIAGNOSIS — O3410 Maternal care for benign tumor of corpus uteri, unspecified trimester: Secondary | ICD-10-CM

## 2014-01-27 DIAGNOSIS — O9903 Anemia complicating the puerperium: Secondary | ICD-10-CM | POA: Diagnosis not present

## 2014-01-27 LAB — CBC
HCT: 38.2 % (ref 36.0–46.0)
Hemoglobin: 13 g/dL (ref 12.0–15.0)
MCH: 30.4 pg (ref 26.0–34.0)
MCHC: 34 g/dL (ref 30.0–36.0)
MCV: 89.5 fL (ref 78.0–100.0)
Platelets: 149 10*3/uL — ABNORMAL LOW (ref 150–400)
RBC: 4.27 MIL/uL (ref 3.87–5.11)
RDW: 14.4 % (ref 11.5–15.5)
WBC: 11.7 10*3/uL — AB (ref 4.0–10.5)

## 2014-01-27 LAB — TYPE AND SCREEN
ABO/RH(D): A POS
ANTIBODY SCREEN: NEGATIVE

## 2014-01-27 LAB — OB RESULTS CONSOLE GBS: GBS: NEGATIVE

## 2014-01-27 LAB — GLUCOSE, CAPILLARY
GLUCOSE-CAPILLARY: 120 mg/dL — AB (ref 70–99)
GLUCOSE-CAPILLARY: 138 mg/dL — AB (ref 70–99)

## 2014-01-27 LAB — RPR

## 2014-01-27 SURGERY — Surgical Case
Anesthesia: Spinal | Site: Abdomen

## 2014-01-27 MED ORDER — LANOLIN HYDROUS EX OINT: 1.0000 "application " | TOPICAL_OINTMENT | CUTANEOUS | Status: DC | PRN

## 2014-01-27 MED ORDER — FENTANYL CITRATE 0.05 MG/ML IJ SOLN
INTRAMUSCULAR | Status: DC | PRN
  Administered 2014-01-27: 25 ug via INTRATHECAL

## 2014-01-27 MED ORDER — LACTATED RINGERS IV BOLUS (SEPSIS)
500.0000 mL | Freq: Once | INTRAVENOUS | Status: AC
  Administered 2014-01-27: 500 mL via INTRAVENOUS

## 2014-01-27 MED ORDER — DIPHENHYDRAMINE HCL 25 MG PO CAPS: 25.0000 mg | ORAL_CAPSULE | Freq: Four times a day (QID) | ORAL | Status: DC | PRN

## 2014-01-27 MED ORDER — ONDANSETRON HCL 4 MG/2ML IJ SOLN: 4.0000 mg | INTRAMUSCULAR | Status: DC | PRN

## 2014-01-27 MED ORDER — KETOROLAC TROMETHAMINE 30 MG/ML IJ SOLN
INTRAMUSCULAR | Status: AC
  Administered 2014-01-27: 30 mg via INTRAVENOUS
  Filled 2014-01-27: qty 1

## 2014-01-27 MED ORDER — MORPHINE SULFATE (PF) 0.5 MG/ML IJ SOLN
INTRAMUSCULAR | Status: DC | PRN
  Administered 2014-01-27: 4.85 mg via INTRAVENOUS

## 2014-01-27 MED ORDER — MENTHOL 3 MG MT LOZG
1.0000 | LOZENGE | OROMUCOSAL | Status: DC | PRN
  Administered 2014-01-28: 3 mg via ORAL
  Filled 2014-01-27: qty 9

## 2014-01-27 MED ORDER — SIMETHICONE 80 MG PO CHEW
80.0000 mg | CHEWABLE_TABLET | Freq: Three times a day (TID) | ORAL | Status: DC
  Administered 2014-01-27 – 2014-01-28 (×4): 80 mg via ORAL
  Filled 2014-01-27 (×4): qty 1

## 2014-01-27 MED ORDER — SENNOSIDES-DOCUSATE SODIUM 8.6-50 MG PO TABS
2.0000 | ORAL_TABLET | ORAL | Status: DC
  Administered 2014-01-28 – 2014-01-29 (×2): 2 via ORAL
  Filled 2014-01-27 (×2): qty 2

## 2014-01-27 MED ORDER — CITRIC ACID-SODIUM CITRATE 334-500 MG/5ML PO SOLN
30.0000 mL | Freq: Once | ORAL | Status: AC
  Administered 2014-01-27: 30 mL via ORAL
  Filled 2014-01-27: qty 15

## 2014-01-27 MED ORDER — OXYTOCIN 40 UNITS IN LACTATED RINGERS INFUSION - SIMPLE MED: 62.5000 mL/h | INTRAVENOUS | Status: AC

## 2014-01-27 MED ORDER — MORPHINE SULFATE 0.5 MG/ML IJ SOLN
INTRAMUSCULAR | Status: AC
  Filled 2014-01-27: qty 10

## 2014-01-27 MED ORDER — LACTATED RINGERS IV SOLN
INTRAVENOUS | Status: DC
  Administered 2014-01-27 (×2): via INTRAVENOUS

## 2014-01-27 MED ORDER — CEFAZOLIN SODIUM-DEXTROSE 2-3 GM-% IV SOLR
2.0000 g | INTRAVENOUS | Status: AC
  Administered 2014-01-27: 2 g via INTRAVENOUS
  Filled 2014-01-27: qty 50

## 2014-01-27 MED ORDER — LACTATED RINGERS IV SOLN
INTRAVENOUS | Status: DC | PRN
  Administered 2014-01-27 (×3): via INTRAVENOUS

## 2014-01-27 MED ORDER — PHENYLEPHRINE 8 MG IN D5W 100 ML (0.08MG/ML) PREMIX OPTIME
INJECTION | INTRAVENOUS | Status: AC
  Filled 2014-01-27: qty 100

## 2014-01-27 MED ORDER — FENTANYL CITRATE 0.05 MG/ML IJ SOLN
INTRAMUSCULAR | Status: DC | PRN
  Administered 2014-01-27: 75 ug via INTRAVENOUS

## 2014-01-27 MED ORDER — DEXTROSE 5 % IV SOLN
1.0000 ug/kg/h | INTRAVENOUS | Status: DC | PRN
  Filled 2014-01-27: qty 2

## 2014-01-27 MED ORDER — OXYTOCIN 10 UNIT/ML IJ SOLN
40.0000 [IU] | INTRAMUSCULAR | Status: DC | PRN
  Administered 2014-01-27: 40 [IU] via INTRAVENOUS

## 2014-01-27 MED ORDER — KETOROLAC TROMETHAMINE 30 MG/ML IJ SOLN
30.0000 mg | Freq: Four times a day (QID) | INTRAMUSCULAR | Status: AC | PRN
  Administered 2014-01-27: 30 mg via INTRAVENOUS

## 2014-01-27 MED ORDER — NALBUPHINE HCL 10 MG/ML IJ SOLN: 5.0000 mg | INTRAMUSCULAR | Status: DC | PRN

## 2014-01-27 MED ORDER — ZOLPIDEM TARTRATE 5 MG PO TABS: 5.0000 mg | ORAL_TABLET | Freq: Every evening | ORAL | Status: DC | PRN

## 2014-01-27 MED ORDER — ONDANSETRON HCL 4 MG/2ML IJ SOLN: 4.0000 mg | Freq: Three times a day (TID) | INTRAMUSCULAR | Status: DC | PRN

## 2014-01-27 MED ORDER — PHENYLEPHRINE HCL 10 MG/ML IJ SOLN: INTRAMUSCULAR | Status: DC | PRN

## 2014-01-27 MED ORDER — ONDANSETRON HCL 4 MG/2ML IJ SOLN
INTRAMUSCULAR | Status: DC | PRN
  Administered 2014-01-27: 4 mg via INTRAVENOUS

## 2014-01-27 MED ORDER — SODIUM CHLORIDE 0.9 % IJ SOLN: 3.0000 mL | INTRAMUSCULAR | Status: DC | PRN

## 2014-01-27 MED ORDER — DIPHENHYDRAMINE HCL 25 MG PO CAPS: 25.0000 mg | ORAL_CAPSULE | ORAL | Status: DC | PRN

## 2014-01-27 MED ORDER — DIPHENHYDRAMINE HCL 50 MG/ML IJ SOLN: 25.0000 mg | INTRAMUSCULAR | Status: DC | PRN

## 2014-01-27 MED ORDER — SCOPOLAMINE 1 MG/3DAYS TD PT72
MEDICATED_PATCH | TRANSDERMAL | Status: AC
  Administered 2014-01-27: 1.5 mg via TRANSDERMAL
  Filled 2014-01-27: qty 1

## 2014-01-27 MED ORDER — ONDANSETRON HCL 4 MG/2ML IJ SOLN
INTRAMUSCULAR | Status: AC
  Filled 2014-01-27: qty 2

## 2014-01-27 MED ORDER — OXYTOCIN 10 UNIT/ML IJ SOLN
INTRAMUSCULAR | Status: AC
  Filled 2014-01-27: qty 4

## 2014-01-27 MED ORDER — TETANUS-DIPHTH-ACELL PERTUSSIS 5-2.5-18.5 LF-MCG/0.5 IM SUSP: 0.5000 mL | Freq: Once | INTRAMUSCULAR | Status: DC

## 2014-01-27 MED ORDER — DIPHENHYDRAMINE HCL 50 MG/ML IJ SOLN: 12.5000 mg | INTRAMUSCULAR | Status: DC | PRN

## 2014-01-27 MED ORDER — PHENYLEPHRINE 8 MG IN D5W 100 ML (0.08MG/ML) PREMIX OPTIME
INJECTION | INTRAVENOUS | Status: DC | PRN
  Administered 2014-01-27: 80 ug/min via INTRAVENOUS

## 2014-01-27 MED ORDER — WITCH HAZEL-GLYCERIN EX PADS: 1.0000 "application " | MEDICATED_PAD | CUTANEOUS | Status: DC | PRN

## 2014-01-27 MED ORDER — HYDROMORPHONE HCL PF 1 MG/ML IJ SOLN: 0.2500 mg | INTRAMUSCULAR | Status: DC | PRN

## 2014-01-27 MED ORDER — OXYCODONE-ACETAMINOPHEN 5-325 MG PO TABS
1.0000 | ORAL_TABLET | ORAL | Status: DC | PRN
  Administered 2014-01-29: 1 via ORAL
  Filled 2014-01-27: qty 1

## 2014-01-27 MED ORDER — METOCLOPRAMIDE HCL 5 MG/ML IJ SOLN: 10.0000 mg | Freq: Three times a day (TID) | INTRAMUSCULAR | Status: DC | PRN

## 2014-01-27 MED ORDER — NALOXONE HCL 0.4 MG/ML IJ SOLN: 0.4000 mg | INTRAMUSCULAR | Status: DC | PRN

## 2014-01-27 MED ORDER — SIMETHICONE 80 MG PO CHEW: 80.0000 mg | CHEWABLE_TABLET | ORAL | Status: DC | PRN

## 2014-01-27 MED ORDER — MORPHINE SULFATE (PF) 0.5 MG/ML IJ SOLN
INTRAMUSCULAR | Status: DC | PRN
  Administered 2014-01-27: .15 mg via INTRATHECAL

## 2014-01-27 MED ORDER — KETOROLAC TROMETHAMINE 30 MG/ML IJ SOLN: 30.0000 mg | Freq: Four times a day (QID) | INTRAMUSCULAR | Status: AC | PRN

## 2014-01-27 MED ORDER — IBUPROFEN 600 MG PO TABS
600.0000 mg | ORAL_TABLET | Freq: Four times a day (QID) | ORAL | Status: DC
  Administered 2014-01-27 – 2014-01-29 (×7): 600 mg via ORAL
  Filled 2014-01-27 (×7): qty 1

## 2014-01-27 MED ORDER — FENTANYL CITRATE 0.05 MG/ML IJ SOLN
INTRAMUSCULAR | Status: AC
  Filled 2014-01-27: qty 2

## 2014-01-27 MED ORDER — SIMETHICONE 80 MG PO CHEW
80.0000 mg | CHEWABLE_TABLET | ORAL | Status: DC
  Administered 2014-01-28 – 2014-01-29 (×2): 80 mg via ORAL
  Filled 2014-01-27 (×2): qty 1

## 2014-01-27 MED ORDER — MEPERIDINE HCL 25 MG/ML IJ SOLN: 6.2500 mg | INTRAMUSCULAR | Status: DC | PRN

## 2014-01-27 MED ORDER — SCOPOLAMINE 1 MG/3DAYS TD PT72
1.0000 | MEDICATED_PATCH | Freq: Once | TRANSDERMAL | Status: DC
  Administered 2014-01-27: 1.5 mg via TRANSDERMAL

## 2014-01-27 MED ORDER — ONDANSETRON HCL 4 MG PO TABS: 4.0000 mg | ORAL_TABLET | ORAL | Status: DC | PRN

## 2014-01-27 MED ORDER — DIBUCAINE 1 % RE OINT: 1.0000 "application " | TOPICAL_OINTMENT | RECTAL | Status: DC | PRN

## 2014-01-27 MED ORDER — PRENATAL MULTIVITAMIN CH
1.0000 | ORAL_TABLET | Freq: Every day | ORAL | Status: DC
  Administered 2014-01-28: 1 via ORAL
  Filled 2014-01-27: qty 1

## 2014-01-27 SURGICAL SUPPLY — 36 items
BENZOIN TINCTURE PRP APPL 2/3 (GAUZE/BANDAGES/DRESSINGS) ×3 IMPLANT
CLAMP CORD UMBIL (MISCELLANEOUS) IMPLANT
CLOSURE WOUND 1/2 X4 (GAUZE/BANDAGES/DRESSINGS) ×1
CLOTH BEACON ORANGE TIMEOUT ST (SAFETY) ×3 IMPLANT
CONTAINER PREFILL 10% NBF 15ML (MISCELLANEOUS) IMPLANT
DRAPE LG THREE QUARTER DISP (DRAPES) IMPLANT
DRSG OPSITE POSTOP 4X10 (GAUZE/BANDAGES/DRESSINGS) ×3 IMPLANT
DURAPREP 26ML APPLICATOR (WOUND CARE) ×3 IMPLANT
ELECT REM PT RETURN 9FT ADLT (ELECTROSURGICAL) ×3
ELECTRODE REM PT RTRN 9FT ADLT (ELECTROSURGICAL) ×1 IMPLANT
EXTRACTOR VACUUM KIWI (MISCELLANEOUS) IMPLANT
EXTRACTOR VACUUM M CUP 4 TUBE (SUCTIONS) IMPLANT
EXTRACTOR VACUUM M CUP 4' TUBE (SUCTIONS)
GLOVE BIO SURGEON STRL SZ 6.5 (GLOVE) ×2 IMPLANT
GLOVE BIO SURGEONS STRL SZ 6.5 (GLOVE) ×1
GLOVE BIOGEL PI IND STRL 7.0 (GLOVE) ×1 IMPLANT
GLOVE BIOGEL PI INDICATOR 7.0 (GLOVE) ×2
GOWN STRL REUS W/TWL LRG LVL3 (GOWN DISPOSABLE) ×6 IMPLANT
KIT ABG SYR 3ML LUER SLIP (SYRINGE) IMPLANT
NEEDLE HYPO 25X5/8 SAFETYGLIDE (NEEDLE) IMPLANT
NS IRRIG 1000ML POUR BTL (IV SOLUTION) ×3 IMPLANT
PACK C SECTION WH (CUSTOM PROCEDURE TRAY) ×3 IMPLANT
PAD OB MATERNITY 4.3X12.25 (PERSONAL CARE ITEMS) ×3 IMPLANT
STAPLER VISISTAT 35W (STAPLE) IMPLANT
STRIP CLOSURE SKIN 1/2X4 (GAUZE/BANDAGES/DRESSINGS) ×2 IMPLANT
SUT MON AB 4-0 PS1 27 (SUTURE) ×3 IMPLANT
SUT PLAIN 0 NONE (SUTURE) IMPLANT
SUT PLAIN 2 0 XLH (SUTURE) ×3 IMPLANT
SUT VIC AB 0 CT1 36 (SUTURE) ×3 IMPLANT
SUT VIC AB 0 CTX 36 (SUTURE) ×4
SUT VIC AB 0 CTX36XBRD ANBCTRL (SUTURE) ×2 IMPLANT
SUT VIC AB 2-0 CT1 27 (SUTURE) ×4
SUT VIC AB 2-0 CT1 TAPERPNT 27 (SUTURE) ×2 IMPLANT
TOWEL OR 17X24 6PK STRL BLUE (TOWEL DISPOSABLE) ×3 IMPLANT
TRAY FOLEY CATH 14FR (SET/KITS/TRAYS/PACK) ×3 IMPLANT
WATER STERILE IRR 1000ML POUR (IV SOLUTION) ×3 IMPLANT

## 2014-01-27 NOTE — MAU Note (Signed)
Contractions  

## 2014-01-27 NOTE — Addendum Note (Signed)
Addendum created 01/27/14 1604 by Hubbard Robinson, CRNA   Modules edited: Notes Section   Notes Section:  File: [id number]

## 2014-01-27 NOTE — Anesthesia Postprocedure Evaluation (Signed)
  Anesthesia Post-op Note  Patient: Kimberly Campbell  Procedure(s) Performed: Procedure(s): CESAREAN SECTION (N/A)  Patient Location: PACU and Mother/Baby  Anesthesia Type:Epidural  Level of Consciousness: awake, alert , oriented and patient cooperative  Airway and Oxygen Therapy: Patient Spontanous Breathing  Post-op Pain: none  Post-op Assessment: Post-op Vital signs reviewed, No signs of Nausea or vomiting, No headache, No backache, No residual numbness and No residual motor weakness  Post-op Vital Signs: Reviewed and stable  Last Vitals:  Filed Vitals:   01/27/14 1416  BP: 119/62  Pulse: 96  Temp: 36.7 C  Resp: 18    Complications: No apparent anesthesia complications

## 2014-01-27 NOTE — Anesthesia Procedure Notes (Signed)
Spinal  Patient location during procedure: OR Preanesthetic Checklist Completed: patient identified, site marked, surgical consent, pre-op evaluation, timeout performed, IV checked, risks and benefits discussed and monitors and equipment checked Spinal Block Patient position: sitting Prep: DuraPrep Patient monitoring: heart rate, cardiac monitor, continuous pulse ox and blood pressure Approach: midline Location: L3-4 Injection technique: single-shot Needle Needle type: Sprotte  Needle gauge: 24 G Needle length: 9 cm Assessment Sensory level: T4 Additional Notes Sprotte thru Touhy; 7cm LOR..... Air Spinal Dosage in OR  Bupivacaine ml       1.6 PFMS04   mcg        150 Fentanyl mcg            25

## 2014-01-27 NOTE — H&P (Signed)
Zoe Goonan is a 33 y.o. G2P0010 at [redacted]w[redacted]d presenting for active labor since 10p. Pt notes worsening contractions , now breathing heavy through ctx . Good fetal movement, No vaginal bleeding, unsure about leaking fluid  [facility]are at [facility] since first trimesterr - dated by early u/s - h/o LEEP - h/o DaVinchi myomectomy, for PCS - GBS neg - GDM, on glyburide since 32 wks   Prenatal Transfer Tool  Maternal Diabetes: Yes:  Diabetes Type:  Insulin/Medication controlled Genetic Screening: Declined Maternal Ultrasounds/Referrals: Normal Fetal Ultrasounds or other Referrals:  None Maternal Substance Abuse:  No Significant Maternal Medications:  None Significant Maternal Lab Results: None     OB History   Grav Para Term Preterm Abortions TAB SAB Ect Mult Living   2 0 0 0 1 0 1 0 0 0      Past Medical History  Diagnosis Date  . Uterine fibroids affecting pregnancy   . No pertinent past medical history   . Headache(784.0)     last one - 05/2011  . Missed abortion 12/2011    no surgery required  . Gestational diabetes    Past Surgical History  Procedure Laterality Date  . Leep    . Wisdom tooth extraction    . Robot assisted myomectomy  04/16/2012    Procedure: ROBOTIC ASSISTED MYOMECTOMY;  Surgeon: Princess Bruins, MD;  Location: [facility];  Service: Gynecology;  Laterality: N/A;  3 hrs.  . Robot assisted myomectomy  04/17/2012    Procedure: ROBOTIC ASSISTED MYOMECTOMY;  Surgeon: Princess Bruins, MD;  Location: [facility];  Service: Gynecology;  Laterality: N/A;   Family History: family history is not on file. Social History:  reports that she has never smoked. She has never used smokeless tobacco. She reports that she does not drink alcohol or use illicit drugs.  Review of Systems - Negative except painful contractions   Dilation: 4 Effacement (%): 80 Station: -2 Exam by:: Jorje Guild RNC Blood pressure 138/85, pulse 77, temperature 98.4 F (36.9 C), temperature  source Oral, resp. rate 22, height 5\' 7"  (1.702 m), weight 106.142 kg (234 lb), last menstrual period 05/10/2013.  Physical Exam: breathing heavy through ctx Gen: well appearing, no distress   Abd: gravid, NT, no RUQ pain LE: 1+ edema, equal bilaterally, non-tender Toco: q 4  FH: baseline 140s, accelerations present, no deceleratons, 10 beat variability  Prenatal labs: ABO, Rh:  A+ Antibody:  neg Rubella:  immune RPR:   NR HBsAg:  neg  HIV:   neg GBS: Negative (05/25 0000)  1 hr Glucola failed  Genetic screening declines, nl AFP Anatomy US nl   Assessment/Plan: 33 y.o. G2P0010 at [redacted]w[redacted]d Active labor, prior myomectomy, given prior  Uterine scar plan PCS. Pt agrees, understands risks bleeding, infection, damage to surrounding organs. GDM. Watch BS PP, alert peds.    Ross Bender A. Ismaeel Arvelo 01/27/2014, 3:08 AM

## 2014-01-27 NOTE — Brief Op Note (Signed)
01/27/2014  4:43 AM  PATIENT:  Kimberly Campbell  33 y.o. female  PRE-OPERATIVE DIAGNOSIS:  previous uterine surgery; in labor  POST-OPERATIVE DIAGNOSIS:  same  PROCEDURE:  Procedure(s): CESAREAN SECTION (N/A)  SURGEON:  Surgeon(s) and Role:    Claiborne Billings A. Pamala Hurry, MD - Primary  PHYSICIAN ASSISTANT:   ASSISTANTSDrake Leach, CNM   ANESTHESIA:   spinal  EBL:  Total I/O In: 8032 [I.V.:2300] Out: 825 [Urine:125; Blood:700]  BLOOD ADMINISTERED:none  DRAINS: Urinary Catheter (Foley)   LOCAL MEDICATIONS USED:  NONE  SPECIMEN:  Source of Specimen:  placenta  DISPOSITION OF SPECIMEN: L&D  COUNTS:  YES  TOURNIQUET:  * No tourniquets in log *  DICTATION: .Note written in EPIC  PLAN OF CARE: Admit to inpatient   PATIENT DISPOSITION:  PACU - hemodynamically stable.   Delay start of Pharmacological VTE agent (>24hrs) due to surgical blood loss or risk of bleeding: yes

## 2014-01-27 NOTE — Anesthesia Postprocedure Evaluation (Signed)
  Anesthesia Post-op Note  Patient: Kimberly Campbell  Procedure(s) Performed: Procedure(s): CESAREAN SECTION (N/A) Last Vitals:  Filed Vitals:   01/27/14 0545  BP: 104/61  Pulse: 93  Temp: 36.8 C  Resp: 20    Patient is awake, responsive, moving her legs, and has signs of resolution of her numbness. Pain and nausea are reasonably well controlled. Vital signs are stable and clinically acceptable. Oxygen saturation is clinically acceptable. There are no apparent anesthetic complications at this time. Patient is ready for discharge.

## 2014-01-27 NOTE — Anesthesia Preprocedure Evaluation (Signed)
Anesthesia Evaluation  Patient identified by MRN, date of birth, ID band Patient awake    Reviewed: Allergy & Precautions, H&P , Patient's Chart, lab work & pertinent test results  Airway Mallampati: II TM Distance: >3 FB Neck ROM: full    Dental no notable dental hx.    Pulmonary  breath sounds clear to auscultation  Pulmonary exam normal       Cardiovascular Exercise Tolerance: Good Rhythm:regular Rate:Normal     Neuro/Psych    GI/Hepatic   Endo/Other  diabetes, GestationalMorbid obesity  Renal/GU      Musculoskeletal   Abdominal   Peds  Hematology   Anesthesia Other Findings   Reproductive/Obstetrics                           Anesthesia Physical Anesthesia Plan  ASA: III and emergent  Anesthesia Plan: Spinal   Post-op Pain Management:    Induction:   Airway Management Planned:   Additional Equipment:   Intra-op Plan:   Post-operative Plan:   Informed Consent: I have reviewed the patients History and Physical, chart, labs and discussed the procedure including the risks, benefits and alternatives for the proposed anesthesia with the patient or authorized representative who has indicated his/her understanding and acceptance.   Dental Advisory Given  Plan Discussed with: CRNA  Anesthesia Plan Comments: (Lab work confirmed with CRNA in room. Platelets okay. Discussed spinal anesthetic, and patient consents to the procedure:  included risk of possible headache,backache, failed block, allergic reaction, and nerve injury. This patient was asked if she had any questions or concerns before the procedure started. )        Anesthesia Quick Evaluation

## 2014-01-27 NOTE — Op Note (Signed)
01/27/2014  4:43 AM  PATIENT: Kimberly Campbell 33 y.o. female  PRE-OPERATIVE DIAGNOSIS: previous uterine surgery; in labor  POST-OPERATIVE DIAGNOSIS: same  PROCEDURE: Procedure(s):  CESAREAN SECTION (N/A)  SURGEON: Surgeon(s) and Role:  Claiborne Billings A. Pamala Hurry, MD - Primary  PHYSICIAN ASSISTANT:  ASSISTANTSDrake Leach, CNM  ANESTHESIA: spinal  EBL: Total I/O  In: 1027 [I.V.:2300]  Out: 825 [Urine:125; Blood:700]  BLOOD ADMINISTERED:none  DRAINS: Urinary Catheter (Foley)  LOCAL MEDICATIONS USED: NONE  SPECIMEN: Source of Specimen: placenta  DISPOSITION OF SPECIMEN: L&D  COUNTS: YES  TOURNIQUET: * No tourniquets in log *  DICTATION: .Note written in EPIC  PLAN OF CARE: Admit to inpatient  PATIENT DISPOSITION: PACU - hemodynamically stable.  Delay start of Pharmacological VTE agent (>24hrs) due to surgical blood loss or risk of bleeding: yes  Findings:  @BABYSEXEBC @ infant,  APGAR (1 MIN): 9   APGAR (5 MINS): 9   APGAR (10 MINS):   Normal uterus, tubes and ovaries, normal placenta. [place], clear amniotic fluid  EBL: per anasthersia Antibiotics:   2g Ancef Complications: none  Indications: This is a 33 y.o. year-old, G2P0010  At [redacted]w[redacted]d admitted for active labor. Given prior myomectomy, for PCS. Risks benefits and alternatives of the procedure were discussed with the patient who agreed to proceed  Procedure:  After informed consent was obtained the patient was taken to the operating room where spinal anesthesia was initiated.  She was prepped and draped in the normal sterile fashion in dorsal supine position with a leftward tilt.  A foley catheter was in place.  A Pfannenstiel skin incision was made 2 cm above the pubic symphysis in the midline with the scalpel.  Dissection was carried down with the Bovie cautery until the fascia was reached. The fascia was incised in the midline. The incision was extended laterally with the Mayo scissors. The inferior aspect of the fascial incision was grasped  with the Coker clamps, elevated up and the underlying rectus muscles were dissected off sharply. The superior aspect of the fascial incision was grasped with the Coker clamps elevated up and the underlying rectus muscles were dissected off sharply.  The peritoneum was entered sharply with dissection. The peritoneal incision was extended superiorly and inferiorly with good visualization of the bladder. The bladder blade was inserted and palpation was done to assess the fetal position and the location of the uterine vessels. The lower segment of the uterus was incised sharply with the scalpel and extended  bluntly in the cephalo-caudal fashion. The infant was grasped, brought to the incision,  rotated and the infant was delivered with fundal pressure. The nose and mouth were bulb suctioned. The cord was clamped and cut. The infant was handed off to the waiting pediatrician. The placenta was expressed. The uterus was exteriorized. The uterus was cleared of all clots and debris. The uterine incision was repaired with 0 Vicryl in a running locked fashion.  A second layer of the same suture was used in an imbricating fashion to obtain excellent hemostasis.  The uterus was then returned to the abdomen, The uterine incision was reinspected and found to be hemostatic. The peritoneum was grasped and closed with 2-0 Vicryl in a running fashion. The pyramidalis muscles were reapproximated.  The cut muscle edges and the underside of the fascia were inspected and found to be hemostatic. The fascia was closed with 0 Vicryl in a single layer. The subcutaneous tissue was irrigated. Scarpa's layer was closed with a 2-0 plain gut suture. The skin  was closed with a 4-0 Monocryl in a single layer. The patient tolerated the procedure well. Sponge lap and needle counts were correct x3 and patient was taken to the recovery room in a stable condition.  Kimberly Campbell A. Lalla Laham 01/27/2014 4:45 AM

## 2014-01-27 NOTE — Transfer of Care (Signed)
Immediate Anesthesia Transfer of Care Note  Patient: Kimberly Campbell  Procedure(s) Performed: Procedure(s): CESAREAN SECTION (N/A)  Patient Location: PACU  Anesthesia Type:Spinal  Level of Consciousness: awake  Airway & Oxygen Therapy: Patient Spontanous Breathing  Post-op Assessment: Report given to PACU RN and Post -op Vital signs reviewed and stable  Post vital signs: stable  Complications: No apparent anesthesia complications

## 2014-01-28 ENCOUNTER — Encounter (HOSPITAL_COMMUNITY): Payer: Self-pay | Admitting: Obstetrics

## 2014-01-28 LAB — CBC
HCT: 30.1 % — ABNORMAL LOW (ref 36.0–46.0)
Hemoglobin: 9.9 g/dL — ABNORMAL LOW (ref 12.0–15.0)
MCH: 29.7 pg (ref 26.0–34.0)
MCHC: 32.9 g/dL (ref 30.0–36.0)
MCV: 90.4 fL (ref 78.0–100.0)
Platelets: 126 K/uL — ABNORMAL LOW (ref 150–400)
RBC: 3.33 MIL/uL — ABNORMAL LOW (ref 3.87–5.11)
RDW: 14.6 % (ref 11.5–15.5)
WBC: 11.2 K/uL — ABNORMAL HIGH (ref 4.0–10.5)

## 2014-01-28 LAB — GLUCOSE, CAPILLARY: Glucose-Capillary: 163 mg/dL — ABNORMAL HIGH (ref 70–99)

## 2014-01-28 MED ORDER — POLYSACCHARIDE IRON COMPLEX 150 MG PO CAPS
150.0000 mg | ORAL_CAPSULE | Freq: Every day | ORAL | Status: DC
  Filled 2014-01-28: qty 1

## 2014-01-28 NOTE — Progress Notes (Addendum)
POSTOPERATIVE DAY # 1 S/P CS   S:         Reports feeling ok - some soreness but no too bad             Tolerating po intake / no nausea / no vomiting / no flatus / no BM             Bleeding is light             Pain controlled with long-acting narcotic - now motrin and percocet             Up ad lib / ambulatory/ voiding QS - sitting in chair this AM  Newborn breast feeding  O:  VS: BP 96/52  Pulse 87  Temp(Src) 98.1 F (36.7 C) (Oral)  Resp 18  Ht 5\' 7"  (1.702 m)  Wt 106.142 kg (234 lb)  BMI 36.64 kg/m2  SpO2 96%  LMP 05/10/2013   LABS:               Recent Labs  01/27/14 0247 01/28/14 0610  WBC 11.7* 11.2*  HGB 13.0 9.9*  PLT 149* 126*               Bloodtype: --/--/A POS (05/25 0247)  Rubella: Immune (11/06 0000)                                             I&O: Intake/Output     05/25 0701 - 05/26 0700 05/26 0701 - 05/27 0700   P.O. 300    I.V. (mL/kg) 500 (4.7)    Total Intake(mL/kg) 800 (7.5)    Urine (mL/kg/hr) 1550 (0.6)    Blood     Total Output 1550     Net -750                    CBG 163 at 0500 ( ate and drank juice just prior to testing - NOT FASTING)             Physical Exam:             Alert and Oriented X3  Lungs: Clear and unlabored  Heart: regular rate and rhythm / no mumurs  Abdomen: soft, non-tender, mildy distended hypoactive BS             Fundus: firm, non-tender, Ueven             Dressing intact honeycomb              Incision:  approximated with suture / no erythema / no ecchymosis / no drainage  Perineum: intact  Lochia: light  Extremities: 2+ pedal edema with generalized dependent edema, no calf pain or tenderness, neg Homans  A:        POD # 1 S/P CS ( previous myomectomy)            ABL anemia - stable             GDM-A2 -delivered  P:        Routine postoperative care              Start iron and colace tomorrow ( no hx constipation in past)             Check true fasting tomorrow -  plan carb  modified diet at home with glucose checks to call if elevated PP / GTT 6-12 weeks with A1c             Advance activity             Consider early DC tomorrow   [place], MSN, [facility] 01/28/2014, 9:05 AM

## 2014-01-28 NOTE — Lactation Note (Signed)
This note was copied from the chart of Kimberly Brianne Maina. Lactation Consultation Note  Patient Name: Kimberly Campbell Date: 01/28/2014 Reason for consult: Follow-up assessment;Difficult latch due to swollen areolas and flattened nipples, now resolving as mom has been using shells and doing some pumping.Lc assisted with latch and NS not used at this feeding.  LC encouraged continued cue feedings at breast and additional pumping pre and post for stimulation of milk flow and production, especially if any formula given.  Mom says she has the guidelines for milk volume needed based on baby's day of life but baby has taken up tp 40 ml's of formula at some feedings.  LC suggested letting baby have brief sucks of formula prior to latch if baby too fussy to latch right away.  FOB present to assist.  [place] reviewed plan with parents and RN, Elmyra Ricks:  Cue feed at breast and minimize formula supplement, pre and post-pump to assist with milk flow and additional stimulation if formula given.  Curved-tip syringes also provided so mom can insert some ebm or formula into NS if needed.   Maternal Data    Feeding Feeding Type: Breast Fed Length of feed: 15 min (per mom)  LATCH Score/Interventions Latch: Repeated attempts needed to sustain latch, nipple held in mouth throughout feeding, stimulation needed to elicit sucking reflex. (improved after repeated latching and increased swallows) Intervention(s): Skin to skin;Teach feeding cues;Waking techniques (breast compression/support; pre-pump and/or sips of formula if ebm not available) Intervention(s): Adjust position;Assist with latch;Breast compression  Audible Swallowing: Spontaneous and intermittent Intervention(s): Skin to skin;Hand expression Intervention(s): Skin to skin;Hand expression;Alternate breast massage  Type of Nipple: Everted at rest and after stimulation  Comfort (Breast/Nipple): Soft / non-tender     Hold (Positioning):  Assistance needed to correctly position infant at breast and maintain latch. Intervention(s): Breastfeeding basics reviewed;Support Pillows;Position options (recommended football position for better control)  LATCH Score: 8  Lactation Tools Discussed/Used   Cue feedings, breast support and compression, signs of proper latch DEBP, NS, curved-tip syringes  Consult Status Consult Status: Follow-up Date: 01/29/14 Follow-up type: In-patient    Landis Gandy 01/28/2014, 4:54 PM

## 2014-01-29 ENCOUNTER — Encounter (HOSPITAL_COMMUNITY): Payer: Self-pay | Admitting: *Deleted

## 2014-01-29 LAB — GLUCOSE, CAPILLARY: GLUCOSE-CAPILLARY: 91 mg/dL (ref 70–99)

## 2014-01-29 MED ORDER — POLYSACCHARIDE IRON COMPLEX 150 MG PO CAPS
150.0000 mg | ORAL_CAPSULE | Freq: Two times a day (BID) | ORAL | Status: DC
  Filled 2014-01-29: qty 1

## 2014-01-29 MED ORDER — DOCUSATE SODIUM 100 MG PO CAPS: 100.0000 mg | ORAL_CAPSULE | Freq: Every day | ORAL | Status: DC

## 2014-01-29 MED ORDER — IBUPROFEN 600 MG PO TABS: 600.0000 mg | ORAL_TABLET | Freq: Four times a day (QID) | ORAL | Status: DC

## 2014-01-29 MED ORDER — POLYSACCHARIDE IRON COMPLEX 150 MG PO CAPS: 150.0000 mg | ORAL_CAPSULE | Freq: Two times a day (BID) | ORAL | Status: DC

## 2014-01-29 MED ORDER — OXYCODONE-ACETAMINOPHEN 5-325 MG PO TABS: 1.0000 | ORAL_TABLET | ORAL | Status: DC | PRN

## 2014-01-29 NOTE — Discharge Summary (Signed)
POSTOPERATIVE DISCHARGE SUMMARY:  Patient ID: Kimberly Campbell MRN: [id number] DOB/AGE: 35-Mar-1982 35 y.o.  Admit date: 01/27/2014 Admission Diagnoses: 37.[redacted] weeks gestation, active labor w/hx of myomectomy for PCS   Discharge date: 01/29/2014 Discharge Diagnoses: S/P Primary C/S on 01/27/14, ABL anemia, A2GDM-delivered        Prenatal history: G2P1011   EDC : 02/14/2014, by Last Menstrual Period  Has received prenatal care at [facility] since [redacted] wks gestation. Primary provider : Dr. Dellis Filbert Prenatal course complicated by prior myomectomy, h/o LEEP, A2GDM on Glyburide, obesity  Prenatal labs: ABO, Rh: --/--/A POS (05/25 0247)  Antibody: NEG (05/25 0247) Rubella:   / Immune RPR: NON REAC (05/25 0247)  HBsAg: Negative (11/06 0000)  HIV: Non-reactive (11/06 0000)  GBS: Negative (05/25 0000)  GTT: 173; failed 3 hr  Medical / Surgical History :  Past medical history:  Past Medical History  Diagnosis Date  . Uterine fibroids affecting pregnancy   . No pertinent past medical history   . Headache(784.0)     last one - 05/2011  . Missed abortion 12/2011    no surgery required  . Gestational diabetes     Past surgical history:  Past Surgical History  Procedure Laterality Date  . Leep    . Wisdom tooth extraction    . Robot assisted myomectomy  04/16/2012    Procedure: ROBOTIC ASSISTED MYOMECTOMY;  Surgeon: Princess Bruins, MD;  Location: [facility];  Service: Gynecology;  Laterality: N/A;  3 hrs.  . Robot assisted myomectomy  04/17/2012    Procedure: ROBOTIC ASSISTED MYOMECTOMY;  Surgeon: Princess Bruins, MD;  Location: [facility];  Service: Gynecology;  Laterality: N/A;  . Cesarean section N/A 01/27/2014    Procedure: CESAREAN SECTION;  Surgeon: Claiborne Billings A. Pamala Hurry, MD;  Location: [facility];  Service: Obstetrics;  Laterality: N/A;     Medications on Admission: Prescriptions prior to admission  Medication Sig Dispense Refill  . glyBURIDE (DIABETA) 2.5 MG tablet Take  2.5 mg by mouth 2 (two) times daily with a meal.      . hydrocortisone cream 1 % Apply 1 application topically 2 (two) times daily as needed for itching.      . nitrofurantoin, macrocrystal-monohydrate, (MACROBID) 100 MG capsule Take 100 mg by mouth 2 (two) times daily.      . Prenatal Vit-Fe Fumarate-FA (PRENATAL MULTIVITAMIN) TABS tablet Take 1 tablet by mouth daily at 12 noon.        Allergies: Review of patient's allergies indicates no known allergies.   Intrapartum Course:  Admited for Urgent primary CS under spinal anesthesia  Postpartum Course: complicated by ABL anemia  Physical Exam:   VSS: Blood pressure 108/70, pulse 67, temperature 98 F (36.7 C), temperature source Oral, resp. rate 18, height 5\' 7"  (1.702 m), weight 234 lb (106.142 kg), last menstrual period 05/10/2013, SpO2 96.00%, unknown if currently breastfeeding.  LABS:  Recent Labs  01/27/14 0247 01/28/14 0610  WBC 11.7* 11.2*  HGB 13.0 9.9*  PLT 149* 126*    Newborn Data Live born female on 01/27/14 Birth Weight: 7 lb 14.8 oz (3595 g) APGAR: 9, 9  See operative report for further details  Home with mother.  Discharge Instructions:  Wound Care: keep clean and dry / remove honeycomb POD 5 Postpartum Instructions: [facility] discharge booklet - instructions reviewed Medications:    Medication List    STOP taking these medications       glyBURIDE 2.5 MG tablet  Commonly known as:  DIABETA  hydrocortisone cream 1 %     nitrofurantoin (macrocrystal-monohydrate) 100 MG capsule  Commonly known as:  MACROBID      TAKE these medications       ibuprofen 600 MG tablet  Commonly known as:  ADVIL,MOTRIN  Take 1 tablet (600 mg total) by mouth every 6 (six) hours.     iron polysaccharides 150 MG capsule  Commonly known as:  NIFEREX  Take 1 capsule (150 mg total) by mouth 2 (two) times daily.     oxyCODONE-acetaminophen 5-325 MG per tablet  Commonly known as:  PERCOCET/ROXICET  Take 1-2 tablets  by mouth every 4 (four) hours as needed for severe pain (moderate - severe pain).     prenatal multivitamin Tabs tablet  Take 1 tablet by mouth daily at 12 noon.            Follow-up Information   Follow up with LAVOIE,MARIE-LYNE, MD. Schedule an appointment as soon as possible for a visit in 6 weeks.   Specialty:  Obstetrics and Gynecology   Contact information:   [address] [place] [address] [phone]         Signed: Graciela Husbands MSN, CNM 01/29/2014, 9:58 AM

## 2014-01-29 NOTE — Progress Notes (Signed)
POD # 2  Subjective: Pt reports feeling well, desires early discharge/ Pain controlled with Motrin and Percocet Tolerating po/Voiding without problems/ No n/v/ Flatus present Activity: ad lib Bleeding is light Newborn info:  Information for the patient's newborn:  Robina, Hamor [[id number]]  female Feeding: breast   Objective: VS: VS:  Filed Vitals:   01/28/14 0048 01/28/14 0500 01/28/14 1841 01/29/14 0537  BP: 124/57 96/52 127/64 108/70  Pulse: 93 87 92 67  Temp: 97.5 F (36.4 C) 98.1 F (36.7 C) 98.1 F (36.7 C) 98 F (36.7 C)  TempSrc: Oral Oral Oral Oral  Resp: 20 18 19 18   Height:      Weight:      SpO2: 96% 96%      I&O: Intake/Output     05/26 0701 - 05/27 0700 05/27 0701 - 05/28 0700   P.O.     I.V. (mL/kg)     Total Intake(mL/kg)     Urine (mL/kg/hr)     Total Output       Net              LABS:  Recent Labs  01/27/14 0247 01/28/14 0610  WBC 11.7* 11.2*  HGB 13.0 9.9*  PLT 149* 126*                           Physical Exam:  General: alert and cooperative CV: Regular rate and rhythm Resp: CTA bilaterally Abdomen: soft, nontender, normal bowel sounds Incision: healing well, no drainage, no erythema, no hernia, no seroma, no swelling, well approximated, honeycomb dsg with scant drk drainage, outlined with no extension Uterine Fundus: firm, below umbilicus, nontender Lochia: minimal Ext: edema 1+ BLE and Homans sign is negative, no sign of DVT    Assessment: POD # 2/ G2P0010/ S/P Primary C/Section d/t labor w/previous myomectomy ABL anemia-stable A2GDM, delivered-stable; FBS 91 this am Doing well and stable for discharge home  Plan: Discharge home RX's: Ibuprofen 600mg  po Q 6 hrs prn pain #30 Refill x 1 Percocet 5/325 1 - 2 tabs po every 4 hrs prn pain #30 Refill x 0 Niferex 150mg  po BID #60 Refill x 1 Carb modified diet and monitor BS at home, call office if elevated Postpartum visit in 6 wks  GTT in 6-12 wks [facility]  booklet given    Signed: Graciela Husbands, MSN, CNM 01/29/2014, 9:11 AM

## 2014-01-29 NOTE — Lactation Note (Signed)
This note was copied from the chart of Kimberly Chidinma Clites. Lactation Consultation Note Baby sleeping on mother 39 chest.  Mother had questions whether giving formula would hurt breastfeeding.  Reviewed supply and demand. Mother states she breastfeeds for 15 min. And when baby comes off she seems fussy.  Reviewed burping and cluster feeding. Recommend watching a feeding to triage what is happening.  Encouraged her to call lactation with the next feeding. Encouraged mother to hand express before and post pump for 15 min after each feeding 4-6 times a day.   Provided mother with volume guidelines. Mother has DEBP at home.   Patient Name: Kimberly Campbell XNATF'T Date: 01/29/2014 Reason for consult: Follow-up assessment   Maternal Data    Feeding    LATCH Score/Interventions                      Lactation Tools Discussed/Used     Consult Status Consult Status: Follow-up Date: 01/30/14 Follow-up type: In-patient    Larry Sierras Lorrayne Ismael 01/29/2014, 11:24 AM

## 2014-02-07 ENCOUNTER — Inpatient Hospital Stay (HOSPITAL_COMMUNITY): Admission: RE | Admit: 2014-02-07 | Payer: Managed Care, Other (non HMO) | Source: Ambulatory Visit

## 2014-02-10 SURGERY — Surgical Case
Anesthesia: Spinal

## 2014-04-12 ENCOUNTER — Ambulatory Visit: Payer: Self-pay | Admitting: Physician Assistant

## 2014-07-07 ENCOUNTER — Encounter (HOSPITAL_COMMUNITY): Payer: Self-pay | Admitting: *Deleted

## 2014-09-09 LAB — OB RESULTS CONSOLE HIV ANTIBODY (ROUTINE TESTING): HIV: NONREACTIVE

## 2014-09-18 ENCOUNTER — Encounter (HOSPITAL_COMMUNITY): Payer: Self-pay | Admitting: Obstetrics & Gynecology

## 2014-10-16 ENCOUNTER — Encounter (HOSPITAL_COMMUNITY): Payer: Self-pay

## 2014-10-16 ENCOUNTER — Inpatient Hospital Stay (HOSPITAL_COMMUNITY)
Admission: AD | Admit: 2014-10-16 | Discharge: 2014-10-16 | Disposition: A | Discharge status: Home / Self Care | Payer: PRIVATE HEALTH INSURANCE | Source: Ambulatory Visit | Attending: Obstetrics and Gynecology | Admitting: Obstetrics and Gynecology

## 2014-10-16 DIAGNOSIS — O9989 Other specified diseases and conditions complicating pregnancy, childbirth and the puerperium: Secondary | ICD-10-CM | POA: Diagnosis not present

## 2014-10-16 DIAGNOSIS — E860 Dehydration: Secondary | ICD-10-CM | POA: Diagnosis not present

## 2014-10-16 DIAGNOSIS — Z3A18 18 weeks gestation of pregnancy: Secondary | ICD-10-CM | POA: Insufficient documentation

## 2014-10-16 DIAGNOSIS — R109 Unspecified abdominal pain: Secondary | ICD-10-CM | POA: Insufficient documentation

## 2014-10-16 DIAGNOSIS — O26899 Other specified pregnancy related conditions, unspecified trimester: Secondary | ICD-10-CM

## 2014-10-16 LAB — URINALYSIS, ROUTINE W REFLEX MICROSCOPIC
Bilirubin Urine: NEGATIVE
Glucose, UA: 500 mg/dL — AB
Ketones, ur: 15 mg/dL — AB
LEUKOCYTES UA: NEGATIVE
NITRITE: NEGATIVE
PH: 6 (ref 5.0–8.0)
Protein, ur: NEGATIVE mg/dL
Specific Gravity, Urine: 1.02 (ref 1.005–1.030)
Urobilinogen, UA: 0.2 mg/dL (ref 0.0–1.0)

## 2014-10-16 LAB — URINE MICROSCOPIC-ADD ON

## 2014-10-16 LAB — WET PREP, GENITAL
CLUE CELLS WET PREP: NONE SEEN
Trich, Wet Prep: NONE SEEN
Yeast Wet Prep HPF POC: NONE SEEN

## 2014-10-16 LAB — GC/CHLAMYDIA PROBE AMP (~~LOC~~) NOT AT ARMC
Chlamydia: NEGATIVE
NEISSERIA GONORRHEA: NEGATIVE

## 2014-10-16 MED ORDER — ACETAMINOPHEN 500 MG PO TABS
1000.0000 mg | ORAL_TABLET | Freq: Once | ORAL | Status: AC
  Administered 2014-10-16: 1000 mg via ORAL
  Filled 2014-10-16: qty 2

## 2014-10-16 NOTE — MAU Note (Signed)
Low abd pain started at 8 pm. Sometimes strong and sometimes mild.  Crampy, tightness, and pressure feeling.  Reports was told she had a short cervix on Monday at office.  No bleeding. No leaking.  Last sex was 4 days ago.

## 2014-10-16 NOTE — MAU Provider Note (Signed)
History     CSN: [id number]  Arrival date and time: 10/16/14 0010   First Provider Initiated Contact with Patient 10/16/14 0134      Chief Complaint  Patient presents with  . Abdominal Pain   HPI Comments: G3P1011 @18 .3 wks c/o lower abdominal pain that started around 8pm. Was initially constant then became intermittent. Felt some pressure. No VB or LOF. Denies vaginal discharge. No recent IC. Not feeling FM yet. No dysuria, polyuria, or hematuria. Denies constipation. Pregnancy complicated by [id number] dx, hx of CS and myomectomy, hx LEEP, short pregnancy interval, and recent finding of CL 2.3 cm on anatomy sono.    OB History    Gravida Para Term Preterm AB TAB SAB Ectopic Multiple Living   3 1 1  0 1 0 1 0 0 1      Past Medical History  Diagnosis Date  . Uterine fibroids affecting pregnancy   . No pertinent past medical history   . Headache(784.0)     last one - 05/2011  . Missed abortion 12/2011    no surgery required  . Gestational diabetes     Past Surgical History  Procedure Laterality Date  . Leep    . Wisdom tooth extraction    . Robot assisted myomectomy  04/17/2012    Procedure: ROBOTIC ASSISTED MYOMECTOMY;  Surgeon: Princess Bruins, MD;  Location: [facility];  Service: Gynecology;  Laterality: N/A;  . Cesarean section N/A 01/27/2014    Procedure: CESAREAN SECTION;  Surgeon: Claiborne Billings A. Pamala Hurry, MD;  Location: [facility];  Service: Obstetrics;  Laterality: N/A;    Family History  Problem Relation Age of Onset  . Hypertension Mother   . Diabetes Father   . Cancer Father     History  Substance Use Topics  . Smoking status: Never Smoker   . Smokeless tobacco: Never Used  . Alcohol Use: No     Comment: rarely    Allergies: No Known Allergies  Prescriptions prior to admission  Medication Sig Dispense Refill Last Dose  . acetaminophen (TYLENOL) 325 MG tablet Take 650 mg by mouth every 6 (six) hours as needed.   Past Month at Unknown time  . Prenatal Vit-Fe  Fumarate-FA (PRENATAL MULTIVITAMIN) TABS tablet Take 1 tablet by mouth daily at 12 noon.   10/15/2014 at Unknown time  . ibuprofen (ADVIL,MOTRIN) 600 MG tablet Take 1 tablet (600 mg total) by mouth every 6 (six) hours. 30 tablet 0   . iron polysaccharides (NIFEREX) 150 MG capsule Take 1 capsule (150 mg total) by mouth 2 (two) times daily. 60 capsule 1   . oxyCODONE-acetaminophen (PERCOCET/ROXICET) 5-325 MG per tablet Take 1-2 tablets by mouth every 4 (four) hours as needed for severe pain (moderate - severe pain). 30 tablet 0     Review of Systems  Constitutional: Negative.   HENT: Negative.   Eyes: Negative.   Respiratory: Negative.   Cardiovascular: Negative.   Gastrointestinal: Negative.   Genitourinary: Negative.   Musculoskeletal: Negative.   Skin: Negative.   Neurological: Negative.   Endo/Heme/Allergies: Negative.   Psychiatric/Behavioral: Negative.    Physical Exam   Blood pressure 135/83, pulse 96, temperature 97.8 F (36.6 C), temperature source Oral, resp. rate 16, height 5\' 7"  (1.702 m), weight 103.692 kg (228 lb 9.6 oz), not currently breastfeeding.  Physical Exam  Constitutional: She is oriented to person, place, and time. She appears well-developed and well-nourished.  HENT:  Head: Normocephalic and atraumatic.  Eyes: Pupils are equal, round, and reactive to  light.  Neck: Normal range of motion. Neck supple.  Cardiovascular: Normal rate and regular rhythm.   Respiratory: Effort normal and breath sounds normal.  GI: Soft. Bowel sounds are normal. She exhibits no distension and no mass. There is no tenderness. There is no rebound and no guarding.  Obese, gravid  Genitourinary:  Speculum: os closed, thin white discharge, no odor Bimanual: no tenderness or mass, gravid uterus, cervix closed, thick Wet prep and GC/CMT cx collected  Musculoskeletal: Normal range of motion.  Neurological: She is alert and oriented to person, place, and time.  Skin: Skin is warm and  dry.  Psychiatric:  Mildly anxious   Results for MAILY, DEBARGE (MRN [id number]) as of 10/16/2014 18:51  Ref. Range 10/16/2014 00:18 10/16/2014 01:35  Yeast Wet Prep HPF POC Latest Range: NONE SEEN   NONE SEEN  Trich, Wet Prep Latest Range: NONE SEEN   NONE SEEN  Clue Cells Wet Prep HPF POC Latest Range: NONE SEEN   NONE SEEN  WBC, Wet Prep HPF POC Latest Range: NONE SEEN   FEW (A)  Color, Urine Latest Range: YELLOW  YELLOW   APPearance Latest Range: CLEAR  CLEAR   Specific Gravity, Urine Latest Range: 1.005-1.030  1.020   pH Latest Range: 5.0-8.0  6.0   Glucose Latest Range: NEGATIVE mg/dL 500 (A)   Bilirubin Urine Latest Range: NEGATIVE  NEGATIVE   Ketones, ur Latest Range: NEGATIVE mg/dL 15 (A)   Protein Latest Range: NEGATIVE mg/dL NEGATIVE   Urobilinogen, UA Latest Range: 0.0-1.0 mg/dL 0.2   Nitrite Latest Range: NEGATIVE  NEGATIVE   Leukocytes, UA Latest Range: NEGATIVE  NEGATIVE   Hgb urine dipstick Latest Range: NEGATIVE  TRACE (A)   WBC, UA Latest Range: <3 WBC/hpf 0-2   RBC / HPF Latest Range: <3 RBC/hpf 0-2   Squamous Epithelial / LPF Latest Range: RARE  RARE   Bacteria, UA Latest Range: RARE  RARE    MAU Course  Procedures UA Wet prep Gc/CMT cx Po hydration-pain diminished shortly after po hydration Tylenol   Assessment and Plan  18.[redacted] weeks gestation Cramping in second trimester Dehydration  Discharge home Rest, OOW today Pelvic rest PTL precautions Increase hydration to 60-80 oz per day Follow-up as scheduled in 2 wks at [place], Andron Marrazzo, N 10/16/2014, 1:41 AM

## 2014-10-16 NOTE — MAU Note (Signed)
Lower abdominal pain. Denies vaginal bleeding. Denies dysuria.

## 2014-10-16 NOTE — Discharge Instructions (Signed)
Keep appointments  Dehydration, Adult Dehydration means your body does not have as much fluid as it needs. Your kidneys, brain, and heart will not work properly without the right amount of fluids and salt.  HOME CARE  Ask your doctor how to replace body fluid losses (rehydrate).  Drink enough fluids to keep your pee (urine) clear or pale yellow.  Drink small amounts of fluids often if you feel sick to your stomach (nauseous) or throw up (vomit).  Eat like you normally do.  Avoid:  Foods or drinks high in sugar.  Bubbly (carbonated) drinks.  Juice.  Very hot or cold fluids.  Drinks with caffeine.  Fatty, greasy foods.  Alcohol.  Tobacco.  Eating too much.  Gelatin desserts.  Wash your hands to avoid spreading germs (bacteria, viruses).  Only take medicine as told by your doctor.  Keep all doctor visits as told. GET HELP RIGHT AWAY IF:   You cannot drink something without throwing up.  You get worse even with treatment.  Your vomit has blood in it or looks greenish.  Your poop (stool) has blood in it or looks black and tarry.  You have not peed in 6 to 8 hours.  You pee a small amount of very dark pee.  You have a fever.  You pass out (faint).  You have belly (abdominal) pain that gets worse or stays in one spot (localizes).  You have a rash, stiff neck, or bad headache.  You get easily annoyed, sleepy, or are hard to wake up.  You feel weak, dizzy, or very thirsty. MAKE SURE YOU:   Understand these instructions.  Will watch your condition.  Will get help right away if you are not doing well or get worse. Document Released: 06/18/2009 Document Revised: 11/14/2011 Document Reviewed: 04/11/2011 [facility] Patient Information 2015 [place], [place]. This information is not intended to replace advice given to you by your health care provider. Make sure you discuss any questions you have with your health care provider. Preterm Labor Information Preterm  labor is when labor starts before you are [redacted] weeks pregnant. The normal length of pregnancy is 39 to 41 weeks.  CAUSES  The cause of preterm labor is not often known. The most common known cause is infection. RISK FACTORS  Having a history of preterm labor.  Having your water break before it should.  Having a placenta that covers the opening of the cervix.  Having a placenta that breaks away from the uterus.  Having a cervix that is too weak to hold the baby in the uterus.  Having too much fluid in the amniotic sac.  Taking drugs or smoking while pregnant.  Not gaining enough weight while pregnant.  Being younger than 62 and older than 36 years old.  Having a low income.  Being African American. SYMPTOMS  Period-like cramps, belly (abdominal) pain, or back pain.  Contractions that are regular, as often as six in an hour. They may be mild or painful.  Contractions that start at the top of the belly. They then move to the lower belly and back.  Lower belly pressure that seems to get stronger.  Bleeding from the vagina.  Fluid leaking from the vagina. TREATMENT  Treatment depends on:  Your condition.  The condition of your baby.  How many weeks pregnant you are. Your doctor may have you:  Take medicine to stop contractions.  Stay in bed except to use the restroom (bed rest).  Stay in the hospital.  WHAT SHOULD YOU DO IF YOU THINK YOU ARE IN PRETERM LABOR? Call your doctor right away. You need to go to the hospital right away.  HOW CAN YOU PREVENT PRETERM LABOR IN FUTURE PREGNANCIES?  Stop smoking, if you smoke.  Maintain healthy weight gain.  Do not take drugs or be around chemicals that are not needed.  Tell your doctor if you think you have an infection.  Tell your doctor if you had a preterm labor before. Document Released: 11/18/2008 Document Revised: 06/12/2013 Document Reviewed: 11/18/2008 [facility] Patient Information 2015 [place], [place]. This  information is not intended to replace advice given to you by your health care provider. Make sure you discuss any questions you have with your health care provider.

## 2015-02-10 ENCOUNTER — Other Ambulatory Visit: Payer: Self-pay | Admitting: Obstetrics & Gynecology

## 2015-02-10 ENCOUNTER — Encounter (HOSPITAL_COMMUNITY): Payer: Self-pay | Admitting: *Deleted

## 2015-02-10 ENCOUNTER — Inpatient Hospital Stay (HOSPITAL_COMMUNITY)
Admission: AD | Admit: 2015-02-10 | Discharge: 2015-02-11 | Disposition: A | Discharge status: Home / Self Care | Payer: PRIVATE HEALTH INSURANCE | Source: Ambulatory Visit | Attending: Obstetrics and Gynecology | Admitting: Obstetrics and Gynecology

## 2015-02-10 DIAGNOSIS — Z3A35 35 weeks gestation of pregnancy: Secondary | ICD-10-CM | POA: Insufficient documentation

## 2015-02-10 DIAGNOSIS — O24419 Gestational diabetes mellitus in pregnancy, unspecified control: Secondary | ICD-10-CM | POA: Diagnosis not present

## 2015-02-10 DIAGNOSIS — O3421 Maternal care for scar from previous cesarean delivery: Secondary | ICD-10-CM | POA: Diagnosis not present

## 2015-02-10 DIAGNOSIS — Z794 Long term (current) use of insulin: Secondary | ICD-10-CM | POA: Diagnosis not present

## 2015-02-10 DIAGNOSIS — O368130 Decreased fetal movements, third trimester, not applicable or unspecified: Secondary | ICD-10-CM | POA: Diagnosis present

## 2015-02-10 DIAGNOSIS — O3663X0 Maternal care for excessive fetal growth, third trimester, not applicable or unspecified: Secondary | ICD-10-CM | POA: Diagnosis not present

## 2015-02-10 HISTORY — DX: Decreased fetal movements, third trimester, not applicable or unspecified: O36.8130

## 2015-02-10 NOTE — MAU Provider Note (Signed)
  History     CSN: [id number]  Arrival date and time: 02/10/15 2154  Orders placed in EPIC: 2159 Provider notified: 2247 Provider 2nd notification: 2330 Provider on unit: 2340 Provider at bedside: 2345     CC: Decreased Fetal Movement  HPI  Ms. Kimberly Campbell is a 34 yo G51P1011 female at 35.[redacted] wks gestation presenting tonight after calling the office reporting no fetal movement x 1.5 days.  She also has c/o abdominal pain/pressure all day. Denies VB or LOF.  Has appointment 02/11/2015 with Dr. Dellis Filbert. Scheduled for repeat C/S on 6/30.  Prenatal care complicated by: uncontrolled GDM class A2 on insulin (Novolin 55 U in AM and hs; Humalog 14 U before breakfast, 18 U before lunch, 30 U before dinner); LGA. Her primary OB provider is Dr. Dellis Filbert.  Past Medical History  Diagnosis Date  . Gestational diabetes   . Decreased fetal movement in pregnancy in third trimester, antepartum 02/10/2015    Past Surgical History  Procedure Laterality Date  . Cesarean section    . Myomectomy      History reviewed. No pertinent family history.  History  Substance Use Topics  . Smoking status: Never Smoker   . Smokeless tobacco: Not on file  . Alcohol Use: No    Allergies: No Known Allergies  Prescriptions prior to admission  Novolin 55 U in AM and hs / Humalog 14 U before breakfast, 18 U before lunch, 30 U before dinner. PNV  Review of Systems  Constitutional: Negative.   HENT: Negative.   Eyes: Negative.   Respiratory: Negative.   Cardiovascular: Positive for leg swelling.  Gastrointestinal: Negative.   Genitourinary:       Increased pelvic pressure  Musculoskeletal: Negative.   Skin: Negative.   Neurological: Positive for tingling.       Tingling in fingers of both hands  Psychiatric/Behavioral: Negative.    NST FHR: 160 bpm / moderate variability / accels present / no decels TOCO: none  Physical Exam   Blood pressure 153/85, pulse 105, temperature 98 F (36.7 C), temperature  source Oral, resp. rate 18, height 5\' 5"  (1.651 m), weight 117.652 kg (259 lb 6 oz).  Physical Exam  Constitutional: She is oriented to person, place, and time. She appears well-developed and well-nourished.  HENT:  Head: Normocephalic and atraumatic.  Eyes: Pupils are equal, round, and reactive to light.  Neck: Normal range of motion.  Cardiovascular: Normal rate, regular rhythm and normal heart sounds.   1+ BLE edema  Respiratory: Effort normal and breath sounds normal.  GI: Soft.  Hypoactive BS  Genitourinary:  Pelvic exam deferred d/t no contractions  Musculoskeletal: Normal range of motion.  Neurological: She is alert and oriented to person, place, and time. She has normal reflexes.  Skin: Skin is warm and dry.  Psychiatric: She has a normal mood and affect. Her behavior is normal. Judgment and thought content normal.    MAU Course  Procedures  NST  Assessment and Plan  34 yo G3P1011 at 35.[redacted] wks gestation GDMA2 - on insulin LGA Previous Cesarean Delivery H/O myomectomy Category 1 FHR tracing  Discharge home [facility] and PTL instructions given Discussed UC's vs pelvic pressure / explained that large pendulous abdomen will cause increased pelvic pressure / recommend wearing maternity belt Keep scheduled appointment today with Dr. Morrie Sheldon MSN, CNM 02/10/2015, 11:56 PM

## 2015-02-10 NOTE — MAU Note (Addendum)
PT  SAYS  LAST FELT  BABY MOVE  AT 0400-  HAS BEEN HAVING  UC  .  BUT  SHE FEELS  BABY  MOVING  NOW IN TRIAGE.    CALLED CNM AT 8PM-  TOLD   TO COME IN.   PT  HAS AN  APPOINTMENT    TOMORROW.     DENIES HSV   AND MRSA.    NO VE IN OFFICE.    C/S  IS  [place]   6-30    FHR IN TRIAGE  -167

## 2015-02-11 NOTE — Discharge Instructions (Signed)
Preterm Labor Information Preterm labor is when labor starts at less than 37 weeks of pregnancy. The normal length of a pregnancy is 39 to 41 weeks. CAUSES Often, there is no identifiable underlying cause as to why a woman goes into preterm labor. One of the most common known causes of preterm labor is infection. Infections of the uterus, cervix, vagina, amniotic sac, bladder, kidney, or even the lungs (pneumonia) can cause labor to start. Other suspected causes of preterm labor include:   Urogenital infections, such as yeast infections and bacterial vaginosis.   Uterine abnormalities (uterine shape, uterine septum, fibroids, or bleeding from the placenta).   A cervix that has been operated on (it may fail to stay closed).   Malformations in the fetus.   Multiple gestations (twins, triplets, and so on).   Breakage of the amniotic sac.  RISK FACTORS  Having a previous history of preterm labor.   Having premature rupture of membranes (PROM).   Having a placenta that covers the opening of the cervix (placenta previa).   Having a placenta that separates from the uterus (placental abruption).   Having a cervix that is too weak to hold the fetus in the uterus (incompetent cervix).   Having too much fluid in the amniotic sac (polyhydramnios).   Taking illegal drugs or smoking while pregnant.   Not gaining enough weight while pregnant.   Being younger than 84 and older than 36 years old.   Having a low socioeconomic status.   Being African American. SYMPTOMS Signs and symptoms of preterm labor include:   Menstrual-like cramps, abdominal pain, or back pain.  Uterine contractions that are regular, as frequent as six in an hour, regardless of their intensity (may be mild or painful).  Contractions that start on the top of the uterus and spread down to the lower abdomen and back.   A sense of increased pelvic pressure.   A watery or bloody mucus discharge that  comes from the vagina.  TREATMENT Depending on the length of the pregnancy and other circumstances, your health care provider may suggest bed rest. If necessary, there are medicines that can be given to stop contractions and to mature the fetal lungs. If labor happens before 34 weeks of pregnancy, a prolonged hospital stay may be recommended. Treatment depends on the condition of both you and the fetus.  WHAT SHOULD YOU DO IF YOU THINK YOU ARE IN PRETERM LABOR? Call your health care provider right away. You will need to go to the hospital to get checked immediately. HOW CAN YOU PREVENT PRETERM LABOR IN FUTURE PREGNANCIES? You should:   Stop smoking if you smoke.  Maintain healthy weight gain and avoid chemicals and drugs that are not necessary.  Be watchful for any type of infection.  Inform your health care provider if you have a known history of preterm labor. Document Released: 11/12/2003 Document Revised: 04/24/2013 Document Reviewed: 09/24/2012 [facility] Patient Information 2015 [place], [place]. This information is not intended to replace advice given to you by your health care provider. Make sure you discuss any questions you have with your health care provider. Fetal Movement Counts Patient Name: __________________________________________________ Patient Due Date: ____________________ Performing a fetal movement count is highly recommended in high-risk pregnancies, but it is good for every pregnant woman to do. Your health care provider may ask you to start counting fetal movements at 28 weeks of the pregnancy. Fetal movements often increase:  After eating a full meal.  After physical activity.  After  eating or drinking something sweet or cold.  At rest. Pay attention to when you feel the baby is most active. This will help you notice a pattern of your baby's sleep and wake cycles and what factors contribute to an increase in fetal movement. It is important to perform a fetal  movement count at the same time each day when your baby is normally most active.  HOW TO COUNT FETAL MOVEMENTS  Find a quiet and comfortable area to sit or lie down on your left side. Lying on your left side provides the best blood and oxygen circulation to your baby.  Write down the day and time on a sheet of paper or in a journal.  Start counting kicks, flutters, swishes, rolls, or jabs in a 2-hour period. You should feel at least 10 movements within 2 hours.  If you do not feel 10 movements in 2 hours, wait 2-3 hours and count again. Look for a change in the pattern or not enough counts in 2 hours. SEEK MEDICAL CARE IF:  You feel less than 10 counts in 2 hours, tried twice.  There is no movement in over an hour.  The pattern is changing or taking longer each day to reach 10 counts in 2 hours.  You feel the baby is not moving as he or she usually does. Date: ____________ Movements: ____________ Start time: ____________ Elizebeth Koller time: ____________  Date: ____________ Movements: ____________ Start time: ____________ Elizebeth Koller time: ____________ Date: ____________ Movements: ____________ Start time: ____________ Elizebeth Koller time: ____________ Date: ____________ Movements: ____________ Start time: ____________ Elizebeth Koller time: ____________ Date: ____________ Movements: ____________ Start time: ____________ Elizebeth Koller time: ____________ Date: ____________ Movements: ____________ Start time: ____________ Elizebeth Koller time: ____________ Date: ____________ Movements: ____________ Start time: ____________ Elizebeth Koller time: ____________ Date: ____________ Movements: ____________ Start time: ____________ Elizebeth Koller time: ____________  Date: ____________ Movements: ____________ Start time: ____________ Elizebeth Koller time: ____________ Date: ____________ Movements: ____________ Start time: ____________ Elizebeth Koller time: ____________ Date: ____________ Movements: ____________ Start time: ____________ Elizebeth Koller time: ____________ Date:  ____________ Movements: ____________ Start time: ____________ Elizebeth Koller time: ____________ Date: ____________ Movements: ____________ Start time: ____________ Elizebeth Koller time: ____________ Date: ____________ Movements: ____________ Start time: ____________ Elizebeth Koller time: ____________ Date: ____________ Movements: ____________ Start time: ____________ Elizebeth Koller time: ____________  Date: ____________ Movements: ____________ Start time: ____________ Elizebeth Koller time: ____________ Date: ____________ Movements: ____________ Start time: ____________ Elizebeth Koller time: ____________ Date: ____________ Movements: ____________ Start time: ____________ Elizebeth Koller time: ____________ Date: ____________ Movements: ____________ Start time: ____________ Elizebeth Koller time: ____________ Date: ____________ Movements: ____________ Start time: ____________ Elizebeth Koller time: ____________ Date: ____________ Movements: ____________ Start time: ____________ Elizebeth Koller time: ____________ Date: ____________ Movements: ____________ Start time: ____________ Elizebeth Koller time: ____________  Date: ____________ Movements: ____________ Start time: ____________ Elizebeth Koller time: ____________ Date: ____________ Movements: ____________ Start time: ____________ Elizebeth Koller time: ____________ Date: ____________ Movements: ____________ Start time: ____________ Elizebeth Koller time: ____________ Date: ____________ Movements: ____________ Start time: ____________ Elizebeth Koller time: ____________ Date: ____________ Movements: ____________ Start time: ____________ Elizebeth Koller time: ____________ Date: ____________ Movements: ____________ Start time: ____________ Elizebeth Koller time: ____________ Date: ____________ Movements: ____________ Start time: ____________ Elizebeth Koller time: ____________  Date: ____________ Movements: ____________ Start time: ____________ Elizebeth Koller time: ____________ Date: ____________ Movements: ____________ Start time: ____________ Elizebeth Koller time: ____________ Date: ____________ Movements: ____________ Start  time: ____________ Elizebeth Koller time: ____________ Date: ____________ Movements: ____________ Start time: ____________ Elizebeth Koller time: ____________ Date: ____________ Movements: ____________ Start time: ____________ Elizebeth Koller time: ____________ Date: ____________ Movements: ____________ Start time: ____________ Elizebeth Koller time: ____________ Date: ____________ Movements: ____________ Start time: ____________ Elizebeth Koller time: ____________  Date:  ____________ Movements: ____________ Start time: ____________ Elizebeth Koller time: ____________ Date: ____________ Movements: ____________ Start time: ____________ Elizebeth Koller time: ____________ Date: ____________ Movements: ____________ Start time: ____________ Elizebeth Koller time: ____________ Date: ____________ Movements: ____________ Start time: ____________ Elizebeth Koller time: ____________ Date: ____________ Movements: ____________ Start time: ____________ Elizebeth Koller time: ____________ Date: ____________ Movements: ____________ Start time: ____________ Elizebeth Koller time: ____________ Date: ____________ Movements: ____________ Start time: ____________ Elizebeth Koller time: ____________  Date: ____________ Movements: ____________ Start time: ____________ Elizebeth Koller time: ____________ Date: ____________ Movements: ____________ Start time: ____________ Elizebeth Koller time: ____________ Date: ____________ Movements: ____________ Start time: ____________ Elizebeth Koller time: ____________ Date: ____________ Movements: ____________ Start time: ____________ Elizebeth Koller time: ____________ Date: ____________ Movements: ____________ Start time: ____________ Elizebeth Koller time: ____________ Date: ____________ Movements: ____________ Start time: ____________ Elizebeth Koller time: ____________ Date: ____________ Movements: ____________ Start time: ____________ Elizebeth Koller time: ____________  Date: ____________ Movements: ____________ Start time: ____________ Elizebeth Koller time: ____________ Date: ____________ Movements: ____________ Start time: ____________ Elizebeth Koller time:  ____________ Date: ____________ Movements: ____________ Start time: ____________ Elizebeth Koller time: ____________ Date: ____________ Movements: ____________ Start time: ____________ Elizebeth Koller time: ____________ Date: ____________ Movements: ____________ Start time: ____________ Elizebeth Koller time: ____________ Date: ____________ Movements: ____________ Start time: ____________ Elizebeth Koller time: ____________ Document Released: 09/21/2006 Document Revised: 01/06/2014 Document Reviewed: 06/18/2012 [employer] Patient Information 2015 [place] [employer]. This information is not intended to replace advice given to you by your health care provider. Make sure you discuss any questions you have with your health care provider.

## 2015-02-19 ENCOUNTER — Inpatient Hospital Stay (HOSPITAL_COMMUNITY): Payer: PRIVATE HEALTH INSURANCE | Admitting: Anesthesiology

## 2015-02-19 ENCOUNTER — Encounter (HOSPITAL_COMMUNITY)
Admission: AD | Disposition: A | Discharge status: Home / Self Care | Payer: Self-pay | Source: Ambulatory Visit | Attending: Obstetrics and Gynecology

## 2015-02-19 ENCOUNTER — Inpatient Hospital Stay (HOSPITAL_COMMUNITY)
Admission: AD | Admit: 2015-02-19 | Discharge: 2015-02-22 | DRG: 765 | Disposition: A | Discharge status: Home / Self Care | Payer: PRIVATE HEALTH INSURANCE | Source: Ambulatory Visit | Attending: Obstetrics and Gynecology | Admitting: Obstetrics and Gynecology

## 2015-02-19 ENCOUNTER — Encounter (HOSPITAL_COMMUNITY): Payer: Self-pay

## 2015-02-19 DIAGNOSIS — Z833 Family history of diabetes mellitus: Secondary | ICD-10-CM | POA: Diagnosis not present

## 2015-02-19 DIAGNOSIS — O99214 Obesity complicating childbirth: Secondary | ICD-10-CM | POA: Diagnosis present

## 2015-02-19 DIAGNOSIS — O09523 Supervision of elderly multigravida, third trimester: Secondary | ICD-10-CM | POA: Diagnosis not present

## 2015-02-19 DIAGNOSIS — O3663X0 Maternal care for excessive fetal growth, third trimester, not applicable or unspecified: Secondary | ICD-10-CM | POA: Diagnosis present

## 2015-02-19 DIAGNOSIS — Z6841 Body Mass Index (BMI) 40.0 and over, adult: Secondary | ICD-10-CM | POA: Diagnosis not present

## 2015-02-19 DIAGNOSIS — O24424 Gestational diabetes mellitus in childbirth, insulin controlled: Secondary | ICD-10-CM | POA: Diagnosis present

## 2015-02-19 DIAGNOSIS — O99119 Other diseases of the blood and blood-forming organs and certain disorders involving the immune mechanism complicating pregnancy, unspecified trimester: Secondary | ICD-10-CM | POA: Diagnosis present

## 2015-02-19 DIAGNOSIS — Z8249 Family history of ischemic heart disease and other diseases of the circulatory system: Secondary | ICD-10-CM

## 2015-02-19 DIAGNOSIS — O3421 Maternal care for scar from previous cesarean delivery: Principal | ICD-10-CM | POA: Diagnosis present

## 2015-02-19 DIAGNOSIS — D696 Thrombocytopenia, unspecified: Secondary | ICD-10-CM | POA: Diagnosis present

## 2015-02-19 DIAGNOSIS — Z98891 History of uterine scar from previous surgery: Secondary | ICD-10-CM

## 2015-02-19 DIAGNOSIS — O3429 Maternal care due to uterine scar from other previous surgery: Secondary | ICD-10-CM | POA: Diagnosis present

## 2015-02-19 DIAGNOSIS — Z3A36 36 weeks gestation of pregnancy: Secondary | ICD-10-CM | POA: Diagnosis present

## 2015-02-19 DIAGNOSIS — O9912 Other diseases of the blood and blood-forming organs and certain disorders involving the immune mechanism complicating childbirth: Secondary | ICD-10-CM | POA: Diagnosis present

## 2015-02-19 DIAGNOSIS — O24419 Gestational diabetes mellitus in pregnancy, unspecified control: Secondary | ICD-10-CM

## 2015-02-19 DIAGNOSIS — Z3490 Encounter for supervision of normal pregnancy, unspecified, unspecified trimester: Secondary | ICD-10-CM

## 2015-02-19 LAB — COMPREHENSIVE METABOLIC PANEL
ALT: 17 U/L (ref 14–54)
AST: 22 U/L (ref 15–41)
Albumin: 2.6 g/dL — ABNORMAL LOW (ref 3.5–5.0)
Alkaline Phosphatase: 173 U/L — ABNORMAL HIGH (ref 38–126)
Anion gap: 7 (ref 5–15)
BUN: 12 mg/dL (ref 6–20)
CO2: 21 mmol/L — ABNORMAL LOW (ref 22–32)
Calcium: 8.8 mg/dL — ABNORMAL LOW (ref 8.9–10.3)
Chloride: 105 mmol/L (ref 101–111)
Creatinine, Ser: 0.65 mg/dL (ref 0.44–1.00)
GFR calc Af Amer: >60 mL/min (ref >60)
GFR calc non Af Amer: >60 mL/min (ref >60)
Glucose, Bld: 103 mg/dL — ABNORMAL HIGH (ref 65–99)
Potassium: 4 mmol/L (ref 3.5–5.1)
Sodium: 133 mmol/L — ABNORMAL LOW (ref 135–145)
Total Bilirubin: 0.4 mg/dL (ref 0.3–1.2)
Total Protein: 5.9 g/dL — ABNORMAL LOW (ref 6.5–8.1)

## 2015-02-19 LAB — CBC
HEMATOCRIT: 37.2 % (ref 36.0–46.0)
HEMOGLOBIN: 12.7 g/dL (ref 12.0–15.0)
MCH: 31.5 pg (ref 26.0–34.0)
MCHC: 34.1 g/dL (ref 30.0–36.0)
MCV: 92.3 fL (ref 78.0–100.0)
PLATELETS: 106 10*3/uL — AB (ref 150–400)
RBC: 4.03 MIL/uL (ref 3.87–5.11)
RDW: 14.2 % (ref 11.5–15.5)
WBC: 6.7 10*3/uL (ref 4.0–10.5)

## 2015-02-19 LAB — URINALYSIS, ROUTINE W REFLEX MICROSCOPIC
Glucose, UA: NEGATIVE mg/dL
Ketones, ur: NEGATIVE mg/dL
Leukocytes, UA: NEGATIVE
Nitrite: NEGATIVE
Protein, ur: 30 mg/dL — AB
Urobilinogen, UA: 0.2 mg/dL (ref 0.0–1.0)
pH: 6 (ref 5.0–8.0)

## 2015-02-19 LAB — URINE MICROSCOPIC-ADD ON

## 2015-02-19 LAB — TYPE AND SCREEN
ABO/RH(D): A POS
ANTIBODY SCREEN: NEGATIVE

## 2015-02-19 LAB — POCT FERN TEST: POCT FERN TEST: POSITIVE

## 2015-02-19 LAB — GLUCOSE, CAPILLARY
Glucose-Capillary: 82 mg/dL (ref 65–99)
Glucose-Capillary: 84 mg/dL (ref 65–99)

## 2015-02-19 LAB — URIC ACID: Uric Acid, Serum: 4.4 mg/dL (ref 2.3–6.6)

## 2015-02-19 LAB — GLUCOSE, RANDOM: Glucose, Bld: 103 mg/dL — ABNORMAL HIGH (ref 65–99)

## 2015-02-19 SURGERY — Surgical Case
Anesthesia: Spinal | Site: Abdomen

## 2015-02-19 MED ORDER — PHENYLEPHRINE 8 MG IN D5W 100 ML (0.08MG/ML) PREMIX OPTIME
INJECTION | INTRAVENOUS | Status: AC
  Filled 2015-02-19: qty 100

## 2015-02-19 MED ORDER — METHYLERGONOVINE MALEATE 0.2 MG/ML IJ SOLN: 0.2000 mg | INTRAMUSCULAR | Status: DC | PRN

## 2015-02-19 MED ORDER — TETANUS-DIPHTH-ACELL PERTUSSIS 5-2.5-18.5 LF-MCG/0.5 IM SUSP: 0.5000 mL | Freq: Once | INTRAMUSCULAR | Status: DC

## 2015-02-19 MED ORDER — SIMETHICONE 80 MG PO CHEW
80.0000 mg | CHEWABLE_TABLET | ORAL | Status: DC
  Administered 2015-02-20 – 2015-02-22 (×3): 80 mg via ORAL
  Filled 2015-02-19 (×3): qty 1

## 2015-02-19 MED ORDER — METHYLERGONOVINE MALEATE 0.2 MG PO TABS: 0.2000 mg | ORAL_TABLET | ORAL | Status: DC | PRN

## 2015-02-19 MED ORDER — OXYTOCIN 40 UNITS IN LACTATED RINGERS INFUSION - SIMPLE MED
INTRAVENOUS | Status: DC | PRN
  Administered 2015-02-19: 40 [IU] via INTRAVENOUS

## 2015-02-19 MED ORDER — OXYCODONE-ACETAMINOPHEN 5-325 MG PO TABS
2.0000 | ORAL_TABLET | ORAL | Status: DC | PRN
  Administered 2015-02-21 (×3): 2 via ORAL
  Filled 2015-02-19 (×3): qty 2

## 2015-02-19 MED ORDER — NALOXONE HCL 0.4 MG/ML IJ SOLN: 0.4000 mg | INTRAMUSCULAR | Status: DC | PRN

## 2015-02-19 MED ORDER — FAMOTIDINE IN NACL 20-0.9 MG/50ML-% IV SOLN
20.0000 mg | Freq: Once | INTRAVENOUS | Status: AC
  Administered 2015-02-19: 20 mg via INTRAVENOUS
  Filled 2015-02-19: qty 50

## 2015-02-19 MED ORDER — NALBUPHINE HCL 10 MG/ML IJ SOLN: 5.0000 mg | Freq: Once | INTRAMUSCULAR | Status: AC | PRN

## 2015-02-19 MED ORDER — DIPHENHYDRAMINE HCL 25 MG PO CAPS
25.0000 mg | ORAL_CAPSULE | ORAL | Status: DC | PRN
  Administered 2015-02-20 (×2): 25 mg via ORAL
  Filled 2015-02-19 (×2): qty 1

## 2015-02-19 MED ORDER — CEFAZOLIN SODIUM 10 G IJ SOLR: 3.0000 g | INTRAMUSCULAR | Status: DC | PRN

## 2015-02-19 MED ORDER — DIBUCAINE 1 % RE OINT: 1.0000 "application " | TOPICAL_OINTMENT | RECTAL | Status: DC | PRN

## 2015-02-19 MED ORDER — CEFAZOLIN SODIUM-DEXTROSE 2-3 GM-% IV SOLR
2.0000 g | INTRAVENOUS | Status: DC
  Filled 2015-02-19: qty 50

## 2015-02-19 MED ORDER — SIMETHICONE 80 MG PO CHEW
80.0000 mg | CHEWABLE_TABLET | Freq: Three times a day (TID) | ORAL | Status: DC
  Administered 2015-02-20 – 2015-02-22 (×7): 80 mg via ORAL
  Filled 2015-02-19 (×7): qty 1

## 2015-02-19 MED ORDER — ACETAMINOPHEN 325 MG PO TABS: 650.0000 mg | ORAL_TABLET | ORAL | Status: DC | PRN

## 2015-02-19 MED ORDER — BUPIVACAINE HCL (PF) 0.25 % IJ SOLN
INTRAMUSCULAR | Status: AC
  Filled 2015-02-19: qty 10

## 2015-02-19 MED ORDER — CITRIC ACID-SODIUM CITRATE 334-500 MG/5ML PO SOLN
30.0000 mL | Freq: Once | ORAL | Status: AC
  Administered 2015-02-19: 30 mL via ORAL
  Filled 2015-02-19: qty 15

## 2015-02-19 MED ORDER — ONDANSETRON HCL 4 MG/2ML IJ SOLN
INTRAMUSCULAR | Status: AC
  Filled 2015-02-19: qty 2

## 2015-02-19 MED ORDER — ONDANSETRON HCL 4 MG/2ML IJ SOLN: 4.0000 mg | Freq: Once | INTRAMUSCULAR | Status: DC | PRN

## 2015-02-19 MED ORDER — PRENATAL MULTIVITAMIN CH
1.0000 | ORAL_TABLET | Freq: Every day | ORAL | Status: DC
  Administered 2015-02-20 – 2015-02-22 (×3): 1 via ORAL
  Filled 2015-02-19 (×5): qty 1

## 2015-02-19 MED ORDER — LACTATED RINGERS IV SOLN
INTRAVENOUS | Status: DC | PRN
  Administered 2015-02-19 (×2): via INTRAVENOUS

## 2015-02-19 MED ORDER — SENNOSIDES-DOCUSATE SODIUM 8.6-50 MG PO TABS
2.0000 | ORAL_TABLET | ORAL | Status: DC
  Administered 2015-02-20 – 2015-02-22 (×3): 2 via ORAL
  Filled 2015-02-19 (×3): qty 2

## 2015-02-19 MED ORDER — SIMETHICONE 80 MG PO CHEW: 80.0000 mg | CHEWABLE_TABLET | ORAL | Status: DC | PRN

## 2015-02-19 MED ORDER — BUPIVACAINE HCL (PF) 0.25 % IJ SOLN
INTRAMUSCULAR | Status: DC | PRN
  Administered 2015-02-19: 20 mL

## 2015-02-19 MED ORDER — DIPHENHYDRAMINE HCL 50 MG/ML IJ SOLN
INTRAMUSCULAR | Status: AC
  Administered 2015-02-19: 12.5 mg via INTRAVENOUS
  Filled 2015-02-19: qty 1

## 2015-02-19 MED ORDER — SODIUM CHLORIDE 0.9 % IJ SOLN: 3.0000 mL | INTRAMUSCULAR | Status: DC | PRN

## 2015-02-19 MED ORDER — MEPERIDINE HCL 25 MG/ML IJ SOLN
6.2500 mg | INTRAMUSCULAR | Status: DC | PRN
Start: 2015-02-19 — End: 2015-02-19

## 2015-02-19 MED ORDER — DEXTROSE 5 % IV SOLN
1.0000 ug/kg/h | INTRAVENOUS | Status: DC | PRN
  Filled 2015-02-19: qty 2

## 2015-02-19 MED ORDER — OXYCODONE-ACETAMINOPHEN 5-325 MG PO TABS
1.0000 | ORAL_TABLET | ORAL | Status: DC | PRN
  Administered 2015-02-20 (×3): 1 via ORAL
  Filled 2015-02-19 (×3): qty 1

## 2015-02-19 MED ORDER — ONDANSETRON HCL 4 MG/2ML IJ SOLN
INTRAMUSCULAR | Status: DC | PRN
  Administered 2015-02-19: 4 mg via INTRAVENOUS

## 2015-02-19 MED ORDER — OXYTOCIN 40 UNITS IN LACTATED RINGERS INFUSION - SIMPLE MED: 62.5000 mL/h | INTRAVENOUS | Status: AC

## 2015-02-19 MED ORDER — BUPIVACAINE LIPOSOME 1.3 % IJ SUSP
20.0000 mL | Freq: Once | INTRAMUSCULAR | Status: AC
  Administered 2015-02-19: 20 mL
  Filled 2015-02-19: qty 20

## 2015-02-19 MED ORDER — SCOPOLAMINE 1 MG/3DAYS TD PT72
1.0000 | MEDICATED_PATCH | Freq: Once | TRANSDERMAL | Status: DC
  Administered 2015-02-19: 1.5 mg via TRANSDERMAL

## 2015-02-19 MED ORDER — MORPHINE SULFATE (PF) 0.5 MG/ML IJ SOLN
INTRAMUSCULAR | Status: DC | PRN
  Administered 2015-02-19: .1 mg via INTRATHECAL

## 2015-02-19 MED ORDER — MORPHINE SULFATE 0.5 MG/ML IJ SOLN
INTRAMUSCULAR | Status: AC
  Filled 2015-02-19: qty 10

## 2015-02-19 MED ORDER — LANOLIN HYDROUS EX OINT: 1.0000 "application " | TOPICAL_OINTMENT | CUTANEOUS | Status: DC | PRN

## 2015-02-19 MED ORDER — BUPIVACAINE HCL (PF) 0.25 % IJ SOLN
INTRAMUSCULAR | Status: AC
  Filled 2015-02-19: qty 30

## 2015-02-19 MED ORDER — IBUPROFEN 600 MG PO TABS
600.0000 mg | ORAL_TABLET | Freq: Four times a day (QID) | ORAL | Status: DC
  Administered 2015-02-20 (×2): 600 mg via ORAL
  Filled 2015-02-19 (×2): qty 1

## 2015-02-19 MED ORDER — ZOLPIDEM TARTRATE 5 MG PO TABS: 5.0000 mg | ORAL_TABLET | Freq: Every evening | ORAL | Status: DC | PRN

## 2015-02-19 MED ORDER — FENTANYL CITRATE (PF) 100 MCG/2ML IJ SOLN: 25.0000 ug | INTRAMUSCULAR | Status: DC | PRN

## 2015-02-19 MED ORDER — WITCH HAZEL-GLYCERIN EX PADS: 1.0000 "application " | MEDICATED_PAD | CUTANEOUS | Status: DC | PRN

## 2015-02-19 MED ORDER — ONDANSETRON HCL 4 MG/2ML IJ SOLN: 4.0000 mg | Freq: Three times a day (TID) | INTRAMUSCULAR | Status: DC | PRN

## 2015-02-19 MED ORDER — LACTATED RINGERS IV SOLN
INTRAVENOUS | Status: DC
  Administered 2015-02-20: 01:00:00 via INTRAVENOUS

## 2015-02-19 MED ORDER — SCOPOLAMINE 1 MG/3DAYS TD PT72
MEDICATED_PATCH | TRANSDERMAL | Status: AC
  Administered 2015-02-19: 1.5 mg via TRANSDERMAL
  Filled 2015-02-19: qty 1

## 2015-02-19 MED ORDER — LACTATED RINGERS IV SOLN
INTRAVENOUS | Status: DC | PRN
  Administered 2015-02-19: 20:00:00 via INTRAVENOUS

## 2015-02-19 MED ORDER — OXYTOCIN 10 UNIT/ML IJ SOLN
INTRAMUSCULAR | Status: AC
  Filled 2015-02-19: qty 4

## 2015-02-19 MED ORDER — FENTANYL CITRATE (PF) 100 MCG/2ML IJ SOLN
INTRAMUSCULAR | Status: DC | PRN
  Administered 2015-02-19: 25 ug via INTRATHECAL

## 2015-02-19 MED ORDER — PHENYLEPHRINE 40 MCG/ML (10ML) SYRINGE FOR IV PUSH (FOR BLOOD PRESSURE SUPPORT)
PREFILLED_SYRINGE | INTRAVENOUS | Status: AC
  Filled 2015-02-19: qty 10

## 2015-02-19 MED ORDER — DIPHENHYDRAMINE HCL 50 MG/ML IJ SOLN
12.5000 mg | INTRAMUSCULAR | Status: DC | PRN
  Administered 2015-02-19: 12.5 mg via INTRAVENOUS

## 2015-02-19 MED ORDER — NALBUPHINE HCL 10 MG/ML IJ SOLN
5.0000 mg | INTRAMUSCULAR | Status: DC | PRN
  Administered 2015-02-20: 5 mg via INTRAVENOUS
  Filled 2015-02-19: qty 1

## 2015-02-19 MED ORDER — CEFAZOLIN SODIUM 10 G IJ SOLR
3.0000 g | INTRAMUSCULAR | Status: DC | PRN
  Administered 2015-02-19: 3 g via INTRAVENOUS

## 2015-02-19 MED ORDER — PHENYLEPHRINE 8 MG IN D5W 100 ML (0.08MG/ML) PREMIX OPTIME
INJECTION | INTRAVENOUS | Status: DC | PRN
  Administered 2015-02-19: 60 ug/min via INTRAVENOUS

## 2015-02-19 MED ORDER — MENTHOL 3 MG MT LOZG: 1.0000 | LOZENGE | OROMUCOSAL | Status: DC | PRN

## 2015-02-19 MED ORDER — PHENYLEPHRINE HCL 10 MG/ML IJ SOLN
INTRAMUSCULAR | Status: DC | PRN
  Administered 2015-02-19 (×2): 80 ug via INTRAVENOUS

## 2015-02-19 MED ORDER — FENTANYL CITRATE (PF) 100 MCG/2ML IJ SOLN
INTRAMUSCULAR | Status: AC
  Filled 2015-02-19: qty 2

## 2015-02-19 MED ORDER — LACTATED RINGERS IV SOLN: INTRAVENOUS | Status: DC

## 2015-02-19 MED ORDER — DIPHENHYDRAMINE HCL 25 MG PO CAPS: 25.0000 mg | ORAL_CAPSULE | Freq: Four times a day (QID) | ORAL | Status: DC | PRN

## 2015-02-19 MED ORDER — LACTATED RINGERS IV BOLUS (SEPSIS): 1000.0000 mL | Freq: Once | INTRAVENOUS | Status: DC

## 2015-02-19 MED ORDER — NALBUPHINE HCL 10 MG/ML IJ SOLN: 5.0000 mg | INTRAMUSCULAR | Status: DC | PRN

## 2015-02-19 SURGICAL SUPPLY — 36 items
CLAMP CORD UMBIL (MISCELLANEOUS) IMPLANT
CLOTH BEACON ORANGE TIMEOUT ST (SAFETY) ×3 IMPLANT
CONTAINER PREFILL 10% NBF 15ML (MISCELLANEOUS) IMPLANT
DERMABOND ADVANCED (GAUZE/BANDAGES/DRESSINGS) ×2
DERMABOND ADVANCED .7 DNX12 (GAUZE/BANDAGES/DRESSINGS) ×1 IMPLANT
DRAPE SHEET LG 3/4 BI-LAMINATE (DRAPES) IMPLANT
DRSG OPSITE POSTOP 4X10 (GAUZE/BANDAGES/DRESSINGS) ×3 IMPLANT
DURAPREP 26ML APPLICATOR (WOUND CARE) ×3 IMPLANT
ELECT REM PT RETURN 9FT ADLT (ELECTROSURGICAL) ×3
ELECTRODE REM PT RTRN 9FT ADLT (ELECTROSURGICAL) ×1 IMPLANT
EXTRACTOR VACUUM M CUP 4 TUBE (SUCTIONS) IMPLANT
EXTRACTOR VACUUM M CUP 4' TUBE (SUCTIONS)
GLOVE BIO SURGEON STRL SZ7.5 (GLOVE) ×3 IMPLANT
GOWN STRL REUS W/TWL LRG LVL3 (GOWN DISPOSABLE) ×6 IMPLANT
KIT ABG SYR 3ML LUER SLIP (SYRINGE) IMPLANT
NEEDLE HYPO 22GX1.5 SAFETY (NEEDLE) ×3 IMPLANT
NEEDLE HYPO 25X5/8 SAFETYGLIDE (NEEDLE) IMPLANT
NEEDLE SPNL 20GX3.5 QUINCKE YW (NEEDLE) IMPLANT
NS IRRIG 1000ML POUR BTL (IV SOLUTION) ×3 IMPLANT
PACK C SECTION WH (CUSTOM PROCEDURE TRAY) ×3 IMPLANT
PAD ABD 8X7 1/2 STERILE (GAUZE/BANDAGES/DRESSINGS) ×3 IMPLANT
SPONGE GAUZE 4X4 12PLY STER LF (GAUZE/BANDAGES/DRESSINGS) ×3 IMPLANT
STAPLER VISISTAT 35W (STAPLE) IMPLANT
SUT MNCRL 0 VIOLET CTX 36 (SUTURE) ×3 IMPLANT
SUT MNCRL AB 3-0 PS2 27 (SUTURE) ×3 IMPLANT
SUT MON AB 2-0 CT1 27 (SUTURE) ×3 IMPLANT
SUT MON AB-0 CT1 36 (SUTURE) ×6 IMPLANT
SUT MONOCRYL 0 CTX 36 (SUTURE) ×6
SUT PLAIN 0 NONE (SUTURE) IMPLANT
SUT PLAIN 2 0 (SUTURE)
SUT PLAIN 2 0 XLH (SUTURE) ×3 IMPLANT
SUT PLAIN ABS 2-0 CT1 27XMFL (SUTURE) IMPLANT
SYR 20CC LL (SYRINGE) IMPLANT
SYR CONTROL 10ML LL (SYRINGE) ×3 IMPLANT
TOWEL OR 17X24 6PK STRL BLUE (TOWEL DISPOSABLE) ×3 IMPLANT
TRAY FOLEY CATH SILVER 14FR (SET/KITS/TRAYS/PACK) ×3 IMPLANT

## 2015-02-19 NOTE — Progress Notes (Signed)
Second page

## 2015-02-19 NOTE — Transfer of Care (Signed)
Immediate Anesthesia Transfer of Care Note  Patient: Kimberly Campbell  Procedure(s) Performed: Procedure(s) with comments: Repeat CESAREAN SECTION (N/A) - EDD: 03/16/15  Patient Location: PACU  Anesthesia Type:Spinal  Level of Consciousness: awake, alert  and oriented  Airway & Oxygen Therapy: Patient Spontanous Breathing  Post-op Assessment: Report given to RN and Post -op Vital signs reviewed and stable  Post vital signs: Reviewed and stable  Last Vitals:  Filed Vitals:   02/19/15 2102  BP:   Pulse:   Temp: 36.6 C  Resp:     Complications: No apparent anesthesia complications

## 2015-02-19 NOTE — MAU Note (Signed)
Pt reported. having gushes of fluid come out and orange mucus  Discharge.  Pt c/o of tightness off in her stomach.Reprots good fetal movment

## 2015-02-19 NOTE — Anesthesia Procedure Notes (Signed)
Spinal Patient location during procedure: OR Preanesthetic Checklist Completed: patient identified, site marked, surgical consent, pre-op evaluation, timeout performed, IV checked, risks and benefits discussed and monitors and equipment checked Spinal Block Patient position: sitting Prep: DuraPrep Patient monitoring: cardiac monitor, continuous pulse ox, blood pressure and heart rate Approach: midline Location: L3-4 Injection technique: catheter Needle Needle type: Tuohy and Sprotte  Needle gauge: 24 G Needle length: 12.7 cm Needle insertion depth: 8 cm Catheter type: closed end flexible Catheter size: 19 g Catheter at skin depth: 16 cm Additional Notes Spinal Dosage in OR  Bupivicaine ml       1.6 PFMS04   mcg        100 Fentanyl mcg            25 (-) asp CSF/Heme

## 2015-02-19 NOTE — Progress Notes (Signed)
Placenta to labor and delivery refrigerator

## 2015-02-19 NOTE — H&P (Signed)
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 female presenting for onset of labor with SROM - desires repeat cesarean section.  Prenatal History: G3P1011   EDC : 03/16/2015, by Other Basis  Prenatal care at [facility]  Primary Ob Provider: Dellis Filbert Prenatal course complicated by [facility] / previous myomectomy / previous CS / borderline DM (pre-gestational) / GDMa2 -insulin / threatened PTL /  LGA - suspect macrosomia /   Prenatal Labs: ABO, Rh: --/--/A POS (06/16 1830) Antibody: PENDING (06/16 1830) Rubella:   indeterminate - offer MMR postpartum RPR:   NR HBsAg:   negative HIV:   NR GTT: ABNORMAL  Medical / Surgical History :  Past medical history:  Past Medical History  Diagnosis Date  . Uterine fibroids affecting pregnancy   . No pertinent past medical history   . Headache(784.0)     last one - 05/2011  . Missed abortion 12/2011    no surgery required  . Gestational diabetes     Past surgical history:  Past Surgical History  Procedure Laterality Date  . Leep    . Wisdom tooth extraction    . Robot assisted myomectomy  04/17/2012    Procedure: ROBOTIC ASSISTED MYOMECTOMY;  Surgeon: Princess Bruins, MD;  Location: [facility];  Service: Gynecology;  Laterality: N/A;  . Cesarean section N/A 01/27/2014    Procedure: CESAREAN SECTION;  Surgeon: Claiborne Billings A. Pamala Hurry, MD;  Location: [facility];  Service: Obstetrics;  Laterality: N/A;   Family History:  Family History  Problem Relation Age of Onset  . Hypertension Mother   . Diabetes Father   . Cancer Father     Social History:  reports that she has never smoked. She has never used smokeless tobacco. She reports that she does not drink alcohol or use illicit drugs.  Allergies: Review of patient's  allergies indicates no known allergies.   Current Medications at time of admission:  Prior to Admission medications   Medication Sig Start Date End Date Taking? Authorizing Provider  acetaminophen (TYLENOL) 500 MG tablet Take 500 mg by mouth every 6 (six) hours as needed for mild pain, moderate pain or headache.   Yes Historical Provider, MD  calcium carbonate (TUMS EX) 750 MG chewable tablet Chew 2 tablets by mouth daily as needed for heartburn.   Yes Historical Provider, MD  insulin lispro (HUMALOG) 100 UNIT/ML injection Inject 14 Units into the skin 3 (three) times daily before meals. Inject 14 units before breakfast, 18 units before lunch, and 34 units before dinner.   Yes Historical Provider, MD  insulin NPH Human (NOVOLIN N) 100 UNIT/ML injection Inject 55 Units into the skin 2 (two) times daily.   Yes Historical Provider, MD  Prenatal Vit-Fe Fumarate-FA (PRENATAL MULTIVITAMIN) TABS tablet Take 1 tablet by mouth daily at 12 noon.   Yes Historical Provider, MD   Review of Systems: Active FM Few mild ctx LOF with gushes this afternoon  Ate 2pm - biscuit and tea ( no insulin with that intake) Reports last insulin this AM   Physical Exam:  VS: Blood pressure 149/93, pulse 102, temperature 98 F (36.7 C), temperature source Oral, resp. rate 18, height 5' 7"  (1.702 m), weight 119.931 kg (264 lb 6.4 oz), not currently breastfeeding.  General: alert and  oriented, appears NAD or pain Heart: RRR Lungs: Clear lung fields Abdomen: Gravid, soft and non-tender, non-distended / uterus: gravid Extremities: pedal edema  FHR: baseline rate 150 / variability decreased / accelerations 10x10 / no decelerations TOCO: mild ctx Q 10  Results for Kimberly Campbell, Kimberly Campbell (MRN [id number]) as of 02/19/2015 19:27  Ref. Range 02/19/2015 18:30  Glucose Latest Ref Range: 65-99 mg/dL 103 (H)  WBC Latest Ref Range: 4.0-10.5 K/uL 6.7  Hemoglobin Latest Ref Range: 12.0-15.0 g/dL 12.7  HCT Latest Ref Range: 36.0-46.0  % 37.2  Platelets Latest Ref Range: 150-400 K/uL 106 (L)    Assessment: 36.[redacted] weeks gestation Previous myomectomy and previous CS x 1 - planned repeat GDMa2 - insulin requiring Thrombocytopenia - antepartum SROM FHR category 2   Plan:  Admit Pre-op for OR - repeat cesarean section Baseline PIH labs with admission labs and glucose  Dr Ronita Hipps admission orders given & OR notified   Artelia Laroche CNM, MSN, [facility] 02/19/2015, 7:12 PM

## 2015-02-19 NOTE — Anesthesia Preprocedure Evaluation (Signed)
Anesthesia Evaluation  Patient identified by MRN, date of birth, ID band Patient awake    Reviewed: Allergy & Precautions, NPO status , Patient's Chart, lab work & pertinent test results  History of Anesthesia Complications Negative for: history of anesthetic complications  Airway Mallampati: II  TM Distance: >3 FB Neck ROM: Full    Dental no notable dental hx. (+) Dental Advisory Given   Pulmonary neg pulmonary ROS,  breath sounds clear to auscultation  Pulmonary exam normal       Cardiovascular negative cardio ROS Normal cardiovascular examRhythm:Regular Rate:Normal     Neuro/Psych  Headaches, negative psych ROS   GI/Hepatic negative GI ROS, Neg liver ROS,   Endo/Other  diabetesMorbid obesity  Renal/GU negative Renal ROS  negative genitourinary   Musculoskeletal negative musculoskeletal ROS (+)   Abdominal   Peds negative pediatric ROS (+)  Hematology negative hematology ROS (+)   Anesthesia Other Findings   Reproductive/Obstetrics (+) Pregnancy                             Anesthesia Physical Anesthesia Plan  ASA: III  Anesthesia Plan: Spinal   Post-op Pain Management:    Induction:   Airway Management Planned:   Additional Equipment:   Intra-op Plan:   Post-operative Plan:   Informed Consent: I have reviewed the patients History and Physical, chart, labs and discussed the procedure including the risks, benefits and alternatives for the proposed anesthesia with the patient or authorized representative who has indicated his/her understanding and acceptance.   Dental advisory given  Plan Discussed with: CRNA  Anesthesia Plan Comments:         Anesthesia Quick Evaluation

## 2015-02-19 NOTE — Op Note (Signed)
Cesarean Section Procedure Note  Indications: macrosomia, previous myomectomy and previous uterine incision kerr x one  Pre-operative Diagnosis: 36 week 3 day pregnancy.  Post-operative Diagnosis: same  Surgeon: Lovenia Kim   Assistants: Cora Collum, FACM  Anesthesia: Local anesthesia 0.25.% bupivacaine and Spinal anesthesia  ASA Class: 2  Procedure Details  The patient was seen in the Holding Room. The risks, benefits, complications, treatment options, and expected outcomes were discussed with the patient.  The patient concurred with the proposed plan, giving informed consent. The risks of anesthesia, infection, bleeding and possible injury to other organs discussed. Injury to bowel, bladder, or ureter with possible need for repair discussed. Possible need for transfusion with secondary risks of hepatitis or HIV acquisition discussed. Post operative complications to include but not limited to DVT, PE and Pneumonia noted. The site of surgery properly noted/marked. The patient was taken to Operating Room # 9, identified as Kimberly Campbell and the procedure verified as C-Section Delivery. A Time Out was held and the above information confirmed.  After induction of anesthesia, the patient was draped and prepped in the usual sterile manner. A Pfannenstiel incision was made and carried down through the subcutaneous tissue to the fascia. Fascial incision was made and extended transversely using Mayo scissors. The fascia was separated from the underlying rectus tissue superiorly and inferiorly. The peritoneum was identified and entered. Peritoneal incision was extended longitudinally. The utero-vesical peritoneal reflection was incised transversely and the bladder flap was bluntly freed from the lower uterine segment. A low transverse uterine incision(Kerr hysterotomy) was made. Delivered from OT presentation with vacuum assistance was a  female with Apgar scores of 7 at one minute and 9 at five  minutes. Bulb suctioning gently performed. Neonatal team in attendance.After the umbilical cord was clamped and cut cord blood was obtained for evaluation. The placenta was removed intact and appeared normal. The uterus was curetted with a dry lap pack. Good hemostasis was noted.The uterine outline, tubes and ovaries appeared normal. The uterine incision was closed with running locked sutures of 0 Monocryl x 2 layers. Hemostasis was observed. Lavage was carried out until clear.The parietal peritoneum was not cllosed with a running 2-0 Monocryl suture was used to close the lower rectus fascia. The fascia was then reapproximated with running sutures of 0 Monocryl. The skin was reapproximated 3-0 monocryl after [place] closure with 2-90 plain.  Instrument, sponge, and needle counts were correct prior the abdominal closure and at the conclusion of the case.   Findings: FTLM, OT  Estimated Blood Loss:  300 mL         Drains: foley                 Specimens: placenta to path                 Complications:  None; patient tolerated the procedure well.         Disposition: PACU - hemodynamically stable.         Condition: stable  Attending Attestation: I performed the procedure.

## 2015-02-20 ENCOUNTER — Encounter (HOSPITAL_COMMUNITY): Payer: Self-pay | Admitting: *Deleted

## 2015-02-20 LAB — GLUCOSE, CAPILLARY
Glucose-Capillary: 204 mg/dL — ABNORMAL HIGH (ref 65–99)
Glucose-Capillary: 244 mg/dL — ABNORMAL HIGH (ref 65–99)
Glucose-Capillary: 216 mg/dL — ABNORMAL HIGH (ref 65–99)

## 2015-02-20 LAB — CBC
HEMATOCRIT: 32.9 % — AB (ref 36.0–46.0)
HEMOGLOBIN: 11 g/dL — AB (ref 12.0–15.0)
MCH: 30.9 pg (ref 26.0–34.0)
MCHC: 33.4 g/dL (ref 30.0–36.0)
MCV: 92.4 fL (ref 78.0–100.0)
Platelets: 86 10*3/uL — ABNORMAL LOW (ref 150–400)
RBC: 3.56 MIL/uL — ABNORMAL LOW (ref 3.87–5.11)
RDW: 14.1 % (ref 11.5–15.5)
WBC: 8.7 10*3/uL (ref 4.0–10.5)

## 2015-02-20 LAB — RPR: RPR Ser Ql: NONREACTIVE

## 2015-02-20 MED ORDER — INSULIN NPH (HUMAN) (ISOPHANE) 100 UNIT/ML ~~LOC~~ SUSP
30.0000 [IU] | SUBCUTANEOUS | Status: DC
  Administered 2015-02-20 – 2015-02-21 (×2): 30 [IU] via SUBCUTANEOUS
  Filled 2015-02-20: qty 10

## 2015-02-20 NOTE — Plan of Care (Signed)
Problem: Discharge Progression Outcomes Goal: Barriers To Progression Addressed/Resolved Outcome: Progressing Pt is having high blood sugars after delivery MD put diabetic consult in for pt. And started pt on insulin tonight.

## 2015-02-20 NOTE — Anesthesia Postprocedure Evaluation (Signed)
  Anesthesia Post-op Note  Patient: Kimberly Campbell  Procedure(s) Performed: Procedure(s) with comments: Repeat CESAREAN SECTION (N/A) - EDD: 03/16/15  Patient Location: Women's Unit  Anesthesia Type:Spinal  Level of Consciousness: awake, alert , oriented and patient cooperative  Airway and Oxygen Therapy: Patient Spontanous Breathing  Post-op Pain: none  Post-op Assessment: Post-op Vital signs reviewed, Patient's Cardiovascular Status Stable, Respiratory Function Stable, Patent Airway, No headache, No backache and Patient able to bend at knees  Post-op Vital Signs: Reviewed and stable  Last Vitals:  Filed Vitals:   02/20/15 0658  BP: 142/87  Pulse: 76  Temp: 36.6 C  Resp: 18    Complications: No apparent anesthesia complications

## 2015-02-20 NOTE — Lactation Note (Signed)
Lactation Consultation Note  Patient Name: Kimberly Campbell EGBTD'V Date: 02/20/2015  Initial visit to see patient for lactation counseling due to infant in the NICU. Mother is a gestational diabetic on insulin during her pregnancy. Mother has had unstable blood sugars post delivery per RN. Patient states she has been able to hold and place baby to breast. Mother reports baby did not latch but nuzzled at the breast. Mother reports that she has a 39 month old child that she attempted to breast feed. Mother reports pumping and breastfeeding and was only able to express 3-5 ml. She had to supplement with formula. Mother is motivated to breastfeed but concerned about the ability to make milk. Mother has a DEBP. Pumping regime reviewed with mother for baby in NICU and instructed to pump 8 times in 24 hours, daily, to stimulate and establish milk supply. Mother denies breast changes this pregnancy. Hand expression taught with mother doing return demonstration. Drop of colostrum expressed from one breast. Instructed how to pump using preemie setting. Pump parts were cleaned since last use and discussed the importance of keeping tubing off the floor and cleaning parts between use. Mother has a a small firm, cyst-like, intake area on the right breast near areola. Advised if cyst was to open to inform medical staff and to cover site when pumping. Mom made aware of O/P services, breastfeeding support groups, community resources, and our phone # for post-discharge questions. LC to follow.   Maternal Data    Feeding    LATCH Score/Interventions                      Lactation Tools Discussed/Used     Consult Status      Gwenevere Abbot 02/20/2015, 7:04 PM

## 2015-02-20 NOTE — Progress Notes (Signed)
Inpatient Diabetes Program Recommendations  AACE/ADA: New Consensus Statement on Inpatient Glycemic Control (2013)  Target Ranges:  Prepandial:   less than 140 mg/dL      Peak postprandial:   less than 180 mg/dL (1-2 hours)      Critically ill patients:  140 - 180 mg/dL    Results for Kimberly Campbell, Kimberly Campbell (MRN [id number]) as of 02/20/2015 22:27  Ref. Range 02/20/2015 11:37 02/20/2015 14:01 02/20/2015 20:12  Glucose-Capillary Latest Ref Range: 65-99 mg/dL 204 (H) 244 (H) 216 (H)    Admit with: SROM- C-section performed 02/19/15  History: GDM  Home DM Meds Prior to Delivery: NPH insulin- 55 units bid           Humalog 14 units breakfast/ 18 units lunch/ 34 units dinner  Current DM Orders: NPH insulin- 30 units bid (to start tonight)    **Do not see that patient has prior history of Diabetes prior to her pregnancy.  **Note patient was managed with high doses of insulin during her last trimester.  **Current CBG orders are for fingerstick glucose levels to be checked fasting and three times daily after meals.    MD- Please consider the following recommendations:  1. Change CBG checks to fasting and three times daily before meals  2. Start Novolog Moderate SSI (0-15 units) TID AC + HS [Please use the Glycemic Control Order set]  3. Patient may not need as much NPH insulin as currently ordered.  May need to reduce NPH, however, would like to wait and see patient's glucose levels tomorrow.  Often times after delivery, patient's require much less insulin than while pregnant.   Will follow Wyn Quaker RN, MSN, CDE Diabetes Coordinator Inpatient Glycemic Control Team Team Pager: [phone] (8a-5p)

## 2015-02-20 NOTE — Progress Notes (Addendum)
POD # 1  Subjective: Pt reports feeling well/ Pain controlled with long acting spinal narcotic Tolerating po/ Foley d/c'd anf due to void/ No n/v/ Flatus absent Activity: ad lib Bleeding is light Newborn info:  Information for the patient's newborn:  Kimberly, Campbell [[id number]]  female   Circumcision: planning/ NICU/ Feeding: breast   Objective:  VS:  Filed Vitals:   02/20/15 0658 02/20/15 0900 02/20/15 1137 02/20/15 1318  BP: 142/87 148/96 125/78 139/76  Pulse: 76 86 77 80  Temp: 97.9 F (36.6 C) 97.9 F (36.6 C) 98.1 F (36.7 C) 97.3 F (36.3 C)  TempSrc: Oral Oral Oral Oral  Resp: 18 16 16 16   Height:      Weight:      SpO2: 95% 99% 96% 96%     I&O: Intake/Output      06/16 0701 - 06/17 0700 06/17 0701 - 06/18 0700   P.O. 1270 340   I.V. (mL/kg) 3558.8 (29.7)    Total Intake(mL/kg) 4828.8 (40.3) 340 (2.8)   Urine (mL/kg/hr) 475 1050 (1.1)   Blood 500    Total Output 975 1050   Net +3853.8 -710           Recent Labs  02/19/15 1830 02/20/15 0508  WBC 6.7 8.7  HGB 12.7 11.0*  HCT 37.2 32.9*  PLT 106* 86*    Blood type: --/--/A POS (06/16 1830) Rubella:   Non-immune  BS postprandial: 82-244  Physical Exam:  General: alert and cooperative CV: Regular rate and rhythm Resp: CTA bilaterally Abdomen: soft, nontender, normal bowel sounds Incision: Covered with Tegaderm and honeycomb dressing; no significant drainage, edema, bruising, or erythema; well approximated with suture Uterine Fundus: firm, below umbilicus, nontender Lochia: minimal Ext: edema 3+ BLE and Homans sign is negative, no sign of DVT    Assessment: POD # 1/ G3P1112/ S/P C/Section d/t SROM, hx myomectomy, macrosomia Dependent edema A2GDM-Insulin dependent, delivered Gestational thrombocytopenia, delivered Rubella non-immune  Plan: Discontinue Motrin Recheck platelets tomorrow Monitor BS closely Ambulate Continue routine post op orders MMR prior to  d/c   Signed: Julianne Handler, N, MSN, CNM 02/20/2015, 3:06 PM

## 2015-02-20 NOTE — Progress Notes (Signed)
Interval Note:  Review of postmeal BS >200 Change to carb modified diet Consult with diabetes coordinator Consult with Dr. Lorayne Marek Novolin-N 30 units tonight and in am

## 2015-02-20 NOTE — Clinical Social Work Maternal (Addendum)
CLINICAL SOCIAL WORK MATERNAL/CHILD NOTE  Patient Details  Name: Kimberly Campbell MRN: [id number] Date of Birth: 08/04/1981  Date:  02/20/2015  Clinical Social Worker Initiating Note:  Antion Andres E. Lety Cullens, LCSW Date/ Time Initiated:  02/20/15/1300     Child's Name:  Kimberly Campbell   Legal Guardian:   (Parents: Raena Hernansez and Margirito Gladney)   Need for Interpreter:  None   Date of Referral:        Reason for Referral:   (No referral-NICU admission.)   Referral Source:      Address:   ([address] [place], [place] [address])  Phone number:  [phone]   Household Members:  Minor Children, Significant Other (Couple has one other child, Kaylee age 1)   Natural Supports (not living in the home):  Friends, Immediate Family, Extended Family (MOB states her mother lives in [place] and FOB's mother is currently visiting from [place])   Professional Supports:     Employment:     Type of Work:  (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  FOB works for [employer] in [place])   Education:      Financial Resources:  Private Insurance   Other Resources:      Cultural/Religious Considerations Which May Impact Care: None stated  Strengths:  Ability to meet basic needs , Compliance with medical plan , Home prepared for child , Pediatrician chosen , Understanding of illness (Pediatric follow up will be with Dr. Bolston at [facility] in [place])   Risk Factors/Current Problems:  None   Cognitive State:  Alert , Insightful , Linear Thinking    Mood/Affect:  Interested , Comfortable , Calm    CSW Assessment: CSW met with MOB in her third floor room/304 to introduce myself, complete assessment due to NICU admission at 36.3 weeks, and to offer support.  MOB was pleasant and welcoming of CSW's visit.  She reports she and baby are doing well and appears to have a good understanding of baby's medical needs at this time.  She reports that her daughter  did not experience the same medical issues and was not admitted to the NICU at birth.  MOB reports no hx of PPD with her first delivery, and was attentive and engaged when CSW provided education on common emotions experienced in the first two weeks of the postpartum period as well as signs and symptoms of PPD to watch for.  MOB asked if she will be able to stay with baby after her discharge if he needs to remain in the hospital.  CSW explained that she will be discharged when she no longer meets medical criteria to be hospitalized and then will be offered to stay with baby the night before he is discharged.  CSW explained that unfortunately, MOB will need to travel back and forth from home in the time in between.  MOB stated understanding.  CSW encouraged her to rest and recuperate during this time.  She reports having everything she needs for baby at home and a great support system.  She appears to be in good spirits and was interested in asking CSW about the move of [facility] to [place].  CSW provided basic information about this move.  MOB states no questions, concerns or needs at this time.  CSW has no social concerns at this time.  CSW explained ongoing support services offered by NICU CSW and gave contact information.  CSW Plan/Description:  Psychosocial Support and Ongoing Assessment of Needs, Patient/Family   Education     Milind Raether Elizabeth, LCSW 02/20/2015, 9:10 PM  

## 2015-02-21 ENCOUNTER — Encounter (HOSPITAL_COMMUNITY): Payer: Self-pay | Admitting: Obstetrics and Gynecology

## 2015-02-21 LAB — CBC
HEMATOCRIT: 32.7 % — AB (ref 36.0–46.0)
Hemoglobin: 11.1 g/dL — ABNORMAL LOW (ref 12.0–15.0)
MCH: 31.1 pg (ref 26.0–34.0)
MCHC: 33.9 g/dL (ref 30.0–36.0)
MCV: 91.6 fL (ref 78.0–100.0)
Platelets: 116 10*3/uL — ABNORMAL LOW (ref 150–400)
RBC: 3.57 MIL/uL — ABNORMAL LOW (ref 3.87–5.11)
RDW: 14.3 % (ref 11.5–15.5)
WBC: 9.2 10*3/uL (ref 4.0–10.5)

## 2015-02-21 LAB — GLUCOSE, CAPILLARY
GLUCOSE-CAPILLARY: 121 mg/dL — AB (ref 65–99)
GLUCOSE-CAPILLARY: 157 mg/dL — AB (ref 65–99)
Glucose-Capillary: 74 mg/dL (ref 65–99)
Glucose-Capillary: 85 mg/dL (ref 65–99)

## 2015-02-21 LAB — HEMOGLOBIN A1C
Hgb A1c MFr Bld: 6.6 % — ABNORMAL HIGH (ref 4.8–5.6)
Mean Plasma Glucose: 143 mg/dL

## 2015-02-21 MED ORDER — INSULIN GLARGINE 100 UNIT/ML ~~LOC~~ SOLN
20.0000 [IU] | Freq: Every day | SUBCUTANEOUS | Status: DC
  Administered 2015-02-21: 20 [IU] via SUBCUTANEOUS
  Filled 2015-02-21: qty 0.2

## 2015-02-21 MED ORDER — IBUPROFEN 600 MG PO TABS
600.0000 mg | ORAL_TABLET | Freq: Four times a day (QID) | ORAL | Status: DC | PRN
  Administered 2015-02-21 – 2015-02-22 (×3): 600 mg via ORAL
  Filled 2015-02-21 (×3): qty 1

## 2015-02-21 MED ORDER — METFORMIN HCL 500 MG PO TABS
1000.0000 mg | ORAL_TABLET | Freq: Two times a day (BID) | ORAL | Status: DC
  Administered 2015-02-21 – 2015-02-22 (×2): 1000 mg via ORAL
  Filled 2015-02-21 (×2): qty 2

## 2015-02-21 NOTE — Plan of Care (Signed)
Problem: Discharge Progression Outcomes Goal: Activity appropriate for discharge plan Outcome: Completed/Met Date Met:  02/21/15 Walks to NICU and tolerates well. Goal: Pain controlled with appropriate interventions Outcome: Completed/Met Date Met:  02/21/15 Good pain control on Motrin and Percocet.     

## 2015-02-21 NOTE — Progress Notes (Signed)
POSTOPERATIVE DAY # 2 S/P CS   S:         Reports feeling more soreness today             Tolerating po intake / no nausea / no vomiting / + flatus / no BM             Bleeding is light             Pain controlled with percocet (last dose last night)             Up ad lib / ambulatory/ voiding QS  Newborn in NICU - hoping to breastfeed             Does not want injections at meals for home - "need something simple with two small kids and baby in NICU"   O:  VS: BP 123/76 mmHg  Pulse 74  Temp(Src) 98.5 F (36.9 C) (Oral)  Resp 15  Ht 5\' 7"  (1.702 m)  Wt 119.931 kg (264 lb 6.4 oz)  BMI 41.40 kg/m2  SpO2 98%  Breastfeeding? Unknown   LABS:               Recent Labs  02/20/15 0508 02/21/15 0505  WBC 8.7 9.2  HGB 11.0* 11.1*  PLT 86* 116*               Bloodtype: --/--/A POS (06/16 1830)  Rubella:      Immune                A1c - 6.6     (pre-existing glucose intolerance)                     Physical Exam:             Alert and Oriented X3 / morbidly obese  Lungs: Clear and unlabored  Heart: regular rate and rhythm / no mumurs  Abdomen: soft, non-tender, non-distended, active BS, large pendulous abdomen - no edema or redness lower panus             Fundus: firm, non-tender, Ueven             Dressing intact              Incision:  approximated with suture / no erythema / no ecchymosis / no drainage  Extremities: 2+ edema, no calf pain or tenderness, negative Homans  A:        POD # 2 S/P cesarean section            Thrombocytopenia resolving             Pre-gestational borderline DM / GDMa2-insulin requiring              P:        Routine postoperative care               Diabetic educator notes reviewed - recommended SSI / patient declines insulin for home at every meal             Initially on regular diet - now on carb modified diet x24 hr             Will transition to lantus and oral metformin for discharge planning then referral to endocrinology in next 1-2  weeks for management of DM             Weight ~250  - 114 kg x 0.2 = 22 units Lantus daily  with known resistance and lack of compliance / will start 20 units with metformin & monitor glucose levels next 24-48 hours until stable for DC             Consider partial metformin dose today if lunch value over 180            Artelia Laroche CNM, MSN, [place] 02/21/2015, 12:32 PM

## 2015-02-21 NOTE — Progress Notes (Signed)
Pt. was not in room for Post Prandial blood sugar. NICU was called and they said patient was not with her baby at this time. Dr. Garwin Brothers aware at 1200 that blood sugar was not done. Rozanna Box, RN

## 2015-02-21 NOTE — Lactation Note (Addendum)
This note was copied from the chart of [facility]. Lactation Consultation Note  Patient Name: Kimberly Campbell TKZSW'[id number] Date: 02/21/2015 Reason for consult: Follow-up assessment   Follow-up with NICU mom at 41 hours at infant's bedside in NICU.  RN called and requested assistance with latching.  Mom is a P2 with GDM (with NPH and regular insulin during pregnancy); post-partum still having to take NPH d/t high blood sugars for mom.   RN stated infant has a tight sublingual frenulum with tongue movement restriction; infant has only take a few sips of milk from the bottle and started gagging with the bottle per RN. Infant is on IVF via umbilical line for dextrose d/t low blood sugars.   LC attempted to assess infant's mouth but could not get any more than very tip of my finger in his mouth.  Infant has a tight jaw and will not open even when whining or crying.  Could not get my finger in his mouth deep enough to elicit a sucking reflex.  RN stated she has been able to get infant to suck on her finger, but infant did not suck with rhythmical sucking nor with tongue extension per RN.   LC noted infant does have a wide, thick, upper frenulum that does not allow for proper flanging of lips.  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   LC attempted to latch infant in football hold on left side, but could not get infant to open his mouth even with a chin tug.   Worked with mom to hand express for several minutes (10+ minutes) and could not get any drops to come out with hand expression.  Mom does have everted nipples.   After being skin-to-skin with mom for about 15 minutes and LC working with mom on hand expression, infant head-bobbed & nuzzled at the breast but would not open his mouth when tried to latch him. Mom asked questions regarding tongue restriction.  Information given as requested regarding effects on breastfeeding and potential long-term effects.  Encouraged parents to look at preferred  providers list on Tongue-Tie Support Group for additional information, pictures, and signs & symptoms.   Encouraged tummy time and explained benefit to helping stretch muscles causing recessed chin and additional restriction on tongue movement.   Encouraged facial massage and massage of TMJ joint to help relax jaw & facial muscles prior to feeding. Discussed consult with NP & RN, gave suggestions for bottle feeding. NP discussed with LC potential need for tube feeding if infant will not suck on bottle.  LC suggested using pacifier for suck training (discussed how to use for suck training with RNs).  LC also suggested ordering consult with SLP for further evaluation of suck and bottle feeding abilities.  Mom's potential discharge for tomorrow (Sunday). Mom has own Medela DEBP at home and denies needing to rent pump from hospital.   Informed mom of pumping rooms in NICU for her to use when she visits.  Encouraged mom to take all pump pieces with her when she leaves including circular filters from top of pump for use at home and hospital.     Mom reports not able to get anything with pumping.  Mom reports only pumping 4 times yesterday.   LC encouraged pumping 8 times per day (every 2 hours during the day and at least once at night).  Encouraged using hands-on pumping technique and hand expression at end of pumping session.   LC explained rationale for the  need to pump 8 times per day.  Referred mom to NICU booklet for additional information on average amount of milk/day of life.   Encouraged mom to keep pumping log in NICU booklet.   Mom asked if LC could continue to follow-up with her next week as mom desires to breastfeed her infant.   Maternal Data Formula Feeding for Exclusion: Yes Reason for exclusion: Admission to Intensive Care Unit (ICU) post-partum Has patient been taught Hand Expression?: Yes Does the patient have breastfeeding experience prior to this delivery?: Yes  Feeding Feeding  Type: Breast Fed Nipple Type: Slow - flow  LATCH Score/Interventions Latch: Too sleepy or reluctant, no latch achieved, no sucking elicited.     Type of Nipple: Everted at rest and after stimulation  Comfort (Breast/Nipple): Soft / non-tender     Hold (Positioning): Assistance needed to correctly position infant at breast and maintain latch. Intervention(s): Support Pillows;Breastfeeding basics reviewed;Skin to skin     Lactation Tools Discussed/Used WIC Program: No   Consult Status Consult Status: Follow-up Date: 02/23/15 Follow-up type: In-patient    Merlene Laughter 02/21/2015, 2:19 PM

## 2015-02-21 NOTE — Progress Notes (Signed)
Inpatient Diabetes Program Recommendations  AACE/ADA: New Consensus Statement on Inpatient Glycemic Control (2013)  Target Ranges:  Prepandial:   less than 140 mg/dL      Peak postprandial:   less than 180 mg/dL (1-2 hours)      Critically ill patients:  140 - 180 mg/dL    Results for Kimberly Campbell, Kimberly Campbell (MRN [id number]) as of 02/21/2015 13:36  Ref. Range 02/21/2015 07:15 02/21/2015 12:31  Glucose-Capillary Latest Ref Range: 65-99 mg/dL 74 121 (H)    Admit with: SROM- C-section performed 02/19/15  History: GDM  Home DM Meds Prior to Delivery: NPH insulin- 55 units bid  Humalog 14 units breakfast/ 18 units lunch/ 34 units dinner  Current DM Orders: Lantus 20 units HS            Metformin 1000 mg bid    **Do not see that patient has prior history of Diabetes prior to her pregnancy.  **Note patient was managed with high doses of insulin during her last trimester.  **Current CBG orders are for fingerstick glucose levels to be checked fasting and three times daily after meals.  **Note patient received NPH insulin 30 units last PM and also 30 units this AM.  NPH has been stopped and patient will start on Lantus 20 units QHS tonight.  Also note patient to start on Metformin 1000 mg bid as well.  Agree with this dose of Lantus for tonight based on 0.2 units/kg dosing.  Note patient does not want to take Novolog insulin with meal times.  Patient likely does not need insulin with meal times after delivery.  Metformin should be a safe and conservative medication to start to help suppress patient's output of excessive glucose from the liver.  Agree with patient following up with Endocrinology in 1-2 weeks after d/c per CMN Tanya Bailey's note.    MD- Please consider the following recommendations:  1. Change CBG checks to fasting and three times daily before meals  2. If patient has issues with continued glucose elevations in the hospital,  could add a Sensitive SSI regimen just for hospital use only and d/c patient on Lantus and Metformin alone.  3. Please make sure patient has a renewed Rx for glucose meter strips at time of d/c (she should have a meter at home that she used during pregnancy).  She should check her CBGs at least 1-2 times per day at home and record all results for her follow-up appt with Endocrinology.  Recommend when patient goes home that she check her CBGs fasting everyday and then get a second check during the day either before a meal or two hours after.  Goals for pre-meal glucose levels are 80-130 mg/dl and goals for 2 hour postprandial glucose levels are less than 180 mg/dl.    Will follow Wyn Quaker RN, MSN, CDE Diabetes Coordinator Inpatient Glycemic Control Team Team Pager: [phone] (8a-5p)

## 2015-02-21 NOTE — Progress Notes (Signed)
Vicente Serene, CNM notified of patient's blood sugar of 74. NPH scheduled to be given at 0800 30units as ordered. Rozanna Box, RN

## 2015-02-21 NOTE — Progress Notes (Signed)
NICU called to tell pt to come back to third floor for 2 hour post prandial blood sugar check. NICU stated pt is not down there. Pt still not back in her room.

## 2015-02-22 DIAGNOSIS — Z3490 Encounter for supervision of normal pregnancy, unspecified, unspecified trimester: Secondary | ICD-10-CM

## 2015-02-22 LAB — GLUCOSE, CAPILLARY
Glucose-Capillary: 73 mg/dL (ref 65–99)
Glucose-Capillary: 149 mg/dL — ABNORMAL HIGH (ref 65–99)

## 2015-02-22 MED ORDER — METFORMIN HCL 1000 MG PO TABS: 1000.0000 mg | ORAL_TABLET | Freq: Two times a day (BID) | ORAL | Status: DC

## 2015-02-22 MED ORDER — OXYCODONE-ACETAMINOPHEN 5-325 MG PO TABS: 1.0000 | ORAL_TABLET | ORAL | Status: DC | PRN

## 2015-02-22 MED ORDER — POLYSACCHARIDE IRON COMPLEX 150 MG PO CAPS: 150.0000 mg | ORAL_CAPSULE | Freq: Every day | ORAL | Status: DC

## 2015-02-22 MED ORDER — INSULIN GLARGINE 100 UNIT/ML ~~LOC~~ SOLN: 15.0000 [IU] | Freq: Every day | SUBCUTANEOUS | Status: DC

## 2015-02-22 MED ORDER — MAGNESIUM 250 MG PO TABS: 250.0000 mg | ORAL_TABLET | Freq: Every day | ORAL | Status: DC

## 2015-02-22 MED ORDER — IBUPROFEN 600 MG PO TABS: 600.0000 mg | ORAL_TABLET | Freq: Four times a day (QID) | ORAL | Status: DC | PRN

## 2015-02-22 NOTE — Progress Notes (Signed)
Discharge teaching complete. Pt understood all instructions and did not have any questions. Pt ambulated to NICU and will leave hospital after visiting with her baby.

## 2015-02-22 NOTE — Discharge Summary (Signed)
POSTOPERATIVE DISCHARGE SUMMARY:  Patient ID: Kimberly Campbell MRN: [id number] DOB/AGE: 36-14-1982 36 y.o.  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  Prenatal history: [id number]   EDC : 03/16/2015, by Other Basis  Prenatal care at [facility]  Primary provider : Dellis Filbert Prenatal course complicated by [facility] / morbid obesity / PCOS - insulin resistance with borderline DM / previous myomectomy / previous cesarean section /  GDMa2 - insulin requiring / macrosomia  Prenatal Labs: ABO, Rh: --/--/A POS (06/16 1830)  Antibody: NEG (06/16 1830) Rubella:    Immune RPR: Non Reactive (06/16 1830)  HBsAg:   negative HIV: Non-reactive (01/05 0000)  GTT : ABNORMAL  / A1C 6.6 at delivery   Medical / Surgical History :  Past medical history:  Past Medical History  Diagnosis Date  . Uterine fibroids affecting pregnancy   . No pertinent past medical history   . Headache(784.0)     last one - 05/2011  . Missed abortion 12/2011    no surgery required  . Gestational diabetes     Past surgical history:  Past Surgical History  Procedure Laterality Date  . Leep    . Wisdom tooth extraction    . Robot assisted myomectomy  04/17/2012    Procedure: ROBOTIC ASSISTED MYOMECTOMY;  Surgeon: Princess Bruins, MD;  Location: [facility];  Service: Gynecology;  Laterality: N/A;  . Cesarean section N/A 01/27/2014    Procedure: CESAREAN SECTION;  Surgeon: Claiborne Billings A. Pamala Hurry, MD;  Location: [facility];  Service: Obstetrics;  Laterality: N/A;  . Cesarean section N/A 02/19/2015    Procedure: Repeat CESAREAN SECTION;  Surgeon: Brien Few, MD;  Location: [facility];  Service: Obstetrics;  Laterality: N/A;  EDD: 03/16/15    Family History:  Family History  Problem  Relation Age of Onset  . Hypertension Mother   . Diabetes Father   . Cancer Father     Social History:  reports that she has never smoked. She has never used smokeless tobacco. She reports that she does not drink alcohol or use illicit drugs.  Allergies: Review of patient's allergies indicates no known allergies.   Current Medications at time of admission:  Prior to Admission medications   Medication Sig Start Date End Date Taking? Authorizing Provider  acetaminophen (TYLENOL) 500 MG tablet Take 500 mg by mouth every 6 (six) hours as needed for mild pain, moderate pain or headache.   Yes Historical Provider, MD  calcium carbonate (TUMS EX) 750 MG chewable tablet Chew 2 tablets by mouth daily as needed for heartburn.   Yes Historical Provider, MD  insulin lispro (HUMALOG) 100 UNIT/ML injection Inject 14 Units into the skin 3 (three) times daily before meals. Inject 14 units before breakfast, 18 units before lunch, and 34 units before dinner.   Yes Historical Provider, MD  insulin NPH Human (NOVOLIN N) 100 UNIT/ML injection Inject 55 Units into the skin 2 (two) times daily.   Yes Historical Provider, MD  Prenatal Vit-Fe Fumarate-FA (PRENATAL MULTIVITAMIN) TABS tablet Take 1 tablet by mouth daily at 12 noon.   Yes Historical Provider, MD   Procedures: Admit in labor - scheduled CS delivery  Cesarean section delivery on 02/19/2015 with delivery of viable female newborn by Dr Ronita Hipps   See operative report for further details APGAR (  1 MIN): 8   APGAR (5 MINS): 9    Admit to NICU - hypoglycemia / tachypnea  Postoperative / postpartum course:  Uncomplicated with discharge on POD *3  Discharge Instructions:  Discharged Condition: stable  Activity: pelvic rest and postoperative restrictions x 2   Diet: Low carb diet / reduce salt / increase water  Medications:    Medication List    STOP taking these medications        calcium carbonate 750 MG chewable tablet  Commonly known as:   TUMS EX     HUMALOG 100 UNIT/ML injection  Generic drug:  insulin lispro     NOVOLIN N 100 UNIT/ML injection  Generic drug:  insulin NPH Human      TAKE these medications        acetaminophen 500 MG tablet  Commonly known as:  TYLENOL  Take 500 mg by mouth every 6 (six) hours as needed for mild pain, moderate pain or headache.     ibuprofen 600 MG tablet  Commonly known as:  ADVIL,MOTRIN  Take 1 tablet (600 mg total) by mouth every 6 (six) hours as needed for mild pain.     insulin glargine 100 UNIT/ML injection  Commonly known as:  LANTUS  Inject 0.15 mLs (15 Units total) into the skin at bedtime.     iron polysaccharides 150 MG capsule  Commonly known as:  NIFEREX  Take 1 capsule (150 mg total) by mouth daily.     Magnesium 250 MG Tabs  Take 1 tablet (250 mg total) by mouth daily.     metFORMIN 1000 MG tablet  Commonly known as:  GLUCOPHAGE  Take 1 tablet (1,000 mg total) by mouth 2 (two) times daily with a meal.     oxyCODONE-acetaminophen 5-325 MG per tablet  Commonly known as:  PERCOCET/ROXICET  Take 1 tablet by mouth every 4 (four) hours as needed (for pain scale 4-7).     prenatal multivitamin Tabs tablet  Take 1 tablet by mouth daily at 12 noon.        Wound Care: keep clean and dry  Postpartum Instructions: [facility] discharge booklet - instructions reviewed  Discharge to: Home  Follow up :  [facility] in 2-3 days for interval visit with Artelia Laroche for BP check / removal of dressing / incision check / glucose recheck - adjustment in insulin as needed [facility] in 6 weeks for routine postpartum visit with Dr Dellis Filbert            Signed: Artelia Laroche CNM, MSN, [facility] 02/22/2015, 8:58 AM

## 2015-02-26 ENCOUNTER — Ambulatory Visit: Payer: Self-pay

## 2015-02-26 NOTE — Lactation Note (Signed)
This note was copied from the chart of [name]. Lactation Consultation Note  Patient Name: Kimberly Campbell CVELF'Y Date: 02/26/2015 Reason for consult: Follow-up assessment   With this mom of a NICU baby, LGA, IGDM, now 89 days old. Mom has been pumping about 5 times a day, and has a history of LMS with her first baby. I assisted mom with pumping, and decreased her to 21 flanges. I had mom do hand expression prior and in between pumping. She was able to expressed about 15 mls or more from her left breast, and a couple of mls from her right. Mom was pleased despite the small amount, to be able to give this to her baby.  Mom has dense breasts,large, about 1-2 fingers width wide, but they position at angles to her sides. They appear normal mostly, and other than gestational diabetes, mom reports no other risk factors for LMS.  I told mom she still has a week left to greatly increase her supply to it's best, and to try and pump every 2-3 hours during the day, and take a 5 hours break at night, unles her brests wake her up full. I told mom that her transition into mature milk may be delayd due to her insulin dependent GDM. Mom knows to call for questions/concerns.    Maternal Data    Feeding Feeding Type: Formula Nipple Type: Regular Length of feed: 45 min  LATCH Score/Interventions                      Lactation Tools Discussed/Used     Consult Status Consult Status: PRN Follow-up type: In-patient (NICU)    Tonna Corner 02/26/2015, 10:17 AM

## 2015-04-22 ENCOUNTER — Encounter (HOSPITAL_COMMUNITY): Payer: Self-pay

## 2015-04-22 ENCOUNTER — Encounter (HOSPITAL_COMMUNITY): Payer: Self-pay | Admitting: Obstetrics and Gynecology

## 2015-06-19 ENCOUNTER — Encounter: Payer: Self-pay | Admitting: Emergency Medicine

## 2015-06-19 ENCOUNTER — Ambulatory Visit
Admission: EM | Admit: 2015-06-19 | Discharge: 2015-06-19 | Disposition: A | Discharge status: Home / Self Care | Payer: PRIVATE HEALTH INSURANCE | Attending: Family Medicine | Admitting: Family Medicine

## 2015-06-19 DIAGNOSIS — H109 Unspecified conjunctivitis: Secondary | ICD-10-CM | POA: Diagnosis not present

## 2015-06-19 DIAGNOSIS — J0110 Acute frontal sinusitis, unspecified: Secondary | ICD-10-CM | POA: Diagnosis not present

## 2015-06-19 MED ORDER — AMOXICILLIN 875 MG PO TABS: 875.0000 mg | ORAL_TABLET | Freq: Two times a day (BID) | ORAL | Status: DC

## 2015-06-19 MED ORDER — SULFACETAMIDE SODIUM 10 % OP SOLN: 2.0000 [drp] | OPHTHALMIC | Status: DC

## 2015-06-19 NOTE — ED Notes (Signed)
Patient c/o cough and chest congestion for a week.  Patient also reports redness and watery drainage from her left eye that started today.  Patient denies fevers.

## 2015-06-19 NOTE — ED Provider Notes (Signed)
CSN: [id number]     Arrival date & time 06/19/15  1459 History   None    Chief Complaint  Patient presents with  . Eye Drainage  . Cough   (Consider location/radiation/quality/duration/timing/severity/associated sxs/prior Treatment) Patient is a 34 y.o. female presenting with cough. The history is provided by the patient.  Cough Cough characteristics:  Productive Sputum characteristics:  Yellow Severity:  Mild Onset quality:  Gradual Duration:  2 weeks Timing:  Constant Progression:  Unchanged Chronicity:  New Smoker: no   Context: upper respiratory infection (2 weeks ago)   Relieved by:  Nothing Associated symptoms: eye discharge (for the past day only associated with redness as well.), headaches and sinus congestion   Associated symptoms comment:  Sinus pressure   Past Medical History  Diagnosis Date  . Decreased fetal movement in pregnancy in third trimester, antepartum 02/10/2015  . Uterine fibroids affecting pregnancy   . No pertinent past medical history   . Headache(784.0)     last one - 05/2011  . Missed abortion 12/2011    no surgery required  . Gestational diabetes    Past Surgical History  Procedure Laterality Date  . Cesarean section    . Myomectomy    . Leep    . Wisdom tooth extraction    . Robot assisted myomectomy  04/17/2012    Procedure: ROBOTIC ASSISTED MYOMECTOMY;  Surgeon: Princess Bruins, MD;  Location: [facility];  Service: Gynecology;  Laterality: N/A;  . Cesarean section N/A 01/27/2014    Procedure: CESAREAN SECTION;  Surgeon: Claiborne Billings A. Pamala Hurry, MD;  Location: [facility];  Service: Obstetrics;  Laterality: N/A;  . Cesarean section N/A 02/19/2015    Procedure: Repeat CESAREAN SECTION;  Surgeon: Brien Few, MD;  Location: [facility];  Service: Obstetrics;  Laterality: N/A;  EDD: 03/16/15   Family History  Problem Relation Age of Onset  . Hypertension Mother   . Diabetes Father   . Cancer Father    Social History  Substance Use Topics  . Smoking  status: Never Smoker   . Smokeless tobacco: Never Used  . Alcohol Use: No     Comment: rarely   OB History    Gravida Para Term Preterm AB TAB SAB Ectopic Multiple Living   6 2 1 1 1  0 1 0  2     Review of Systems  Eyes: Positive for discharge (for the past day only associated with redness as well.).  Respiratory: Positive for cough.   Neurological: Positive for headaches.    Allergies  Review of patient's allergies indicates no known allergies.  Home Medications   Prior to Admission medications   Medication Sig Start Date End Date Taking? Authorizing Provider  etonogestrel (NEXPLANON) 68 MG IMPL implant 1 each by Subdermal route once.   Yes Historical Provider, MD  acetaminophen (TYLENOL) 500 MG tablet Take 500 mg by mouth every 6 (six) hours as needed for mild pain, moderate pain or headache.    Historical Provider, MD  amoxicillin (AMOXIL) 875 MG tablet Take 1 tablet (875 mg total) by mouth 2 (two) times daily. 06/19/15   Norval Gable, MD  ibuprofen (ADVIL,MOTRIN) 600 MG tablet Take 1 tablet (600 mg total) by mouth every 6 (six) hours as needed for mild pain. 02/22/15   Artelia Laroche, CNM  insulin glargine (LANTUS) 100 UNIT/ML injection Inject 0.15 mLs (15 Units total) into the skin at bedtime. 02/22/15   Artelia Laroche, CNM  iron polysaccharides (NIFEREX) 150 MG capsule Take 1 capsule (150  mg total) by mouth daily. 02/22/15   Artelia Laroche, CNM  Magnesium 250 MG TABS Take 1 tablet (250 mg total) by mouth daily. 02/22/15   Artelia Laroche, CNM  metFORMIN (GLUCOPHAGE) 1000 MG tablet Take 1 tablet (1,000 mg total) by mouth 2 (two) times daily with a meal. 02/22/15   Artelia Laroche, CNM  oxyCODONE-acetaminophen (PERCOCET/ROXICET) 5-325 MG per tablet Take 1 tablet by mouth every 4 (four) hours as needed (for pain scale 4-7). 02/22/15   Artelia Laroche, CNM  Prenatal Vit-Fe Fumarate-FA (PRENATAL MULTIVITAMIN) TABS tablet Take 1 tablet by mouth daily at 12 noon.    Historical Provider, MD  sulfacetamide  (BLEPH-10) 10 % ophthalmic solution Place 2 drops into the left eye every 4 (four) hours. 06/19/15   Norval Gable, MD   Meds Ordered and Administered this Visit  Medications - No data to display  BP 144/88 mmHg  Pulse 79  Temp(Src) 97.9 F (36.6 C) (Tympanic)  Resp 16  Ht 5\' 7"  (1.702 m)  SpO2 100%  Breastfeeding? Unknown No data found.   Physical Exam  Constitutional: She appears well-developed and well-nourished. No distress.  HENT:  Head: Normocephalic and atraumatic.  Right Ear: Tympanic membrane, external ear and ear canal normal.  Left Ear: Tympanic membrane, external ear and ear canal normal.  Nose: Mucosal edema and rhinorrhea present. No nose lacerations, sinus tenderness, nasal deformity, septal deviation or nasal septal hematoma. No epistaxis.  No foreign bodies. Right sinus exhibits frontal sinus tenderness. Right sinus exhibits no maxillary sinus tenderness. Left sinus exhibits frontal sinus tenderness. Left sinus exhibits no maxillary sinus tenderness.  Mouth/Throat: Uvula is midline, oropharynx is clear and moist and mucous membranes are normal. No oropharyngeal exudate.  Eyes: EOM are normal. Pupils are equal, round, and reactive to light. Right eye exhibits no discharge. Left eye exhibits discharge. Left conjunctiva is injected. No scleral icterus.  Neck: Normal range of motion. Neck supple. No thyromegaly present.  Cardiovascular: Normal rate, regular rhythm and normal heart sounds.   Pulmonary/Chest: Effort normal and breath sounds normal. No respiratory distress. She has no wheezes. She has no rales.  Lymphadenopathy:    She has no cervical adenopathy.  Skin: No rash noted. She is not diaphoretic.  Nursing note and vitals reviewed.   ED Course  Procedures (including critical care time)  Labs Review Labs Reviewed - No data to display  Imaging Review No results found.   Visual Acuity Review  Right Eye Distance: 20/20 corrected Left Eye Distance: 20/30  corrected Bilateral Distance:    Right Eye Near:   Left Eye Near:    Bilateral Near:         MDM   1. Acute frontal sinusitis, recurrence not specified   2. Conjunctivitis of left eye    New Prescriptions   AMOXICILLIN (AMOXIL) 875 MG TABLET    Take 1 tablet (875 mg total) by mouth 2 (two) times daily.   SULFACETAMIDE (BLEPH-10) 10 % OPHTHALMIC SOLUTION    Place 2 drops into the left eye every 4 (four) hours.  1. diagnosis reviewed with patient 2. rx as per orders above; reviewed possible side effects, interactions, risks and benefits  3. Recommend supportive treatment with rest, increased fluids, otc analgesics prn  4. Follow prn if symptoms worsen or don't improve    Norval Gable, MD 06/19/15 [phone]

## 2015-07-06 ENCOUNTER — Ambulatory Visit
Admission: EM | Admit: 2015-07-06 | Discharge: 2015-07-06 | Disposition: A | Discharge status: Home / Self Care | Payer: Managed Care, Other (non HMO) | Attending: Family Medicine | Admitting: Family Medicine

## 2015-07-06 DIAGNOSIS — J209 Acute bronchitis, unspecified: Secondary | ICD-10-CM | POA: Diagnosis not present

## 2015-07-06 DIAGNOSIS — J320 Chronic maxillary sinusitis: Secondary | ICD-10-CM | POA: Diagnosis not present

## 2015-07-06 DIAGNOSIS — H6123 Impacted cerumen, bilateral: Secondary | ICD-10-CM

## 2015-07-06 MED ORDER — SALINE SPRAY 0.65 % NA SOLN: 2.0000 | NASAL | Status: DC

## 2015-07-06 MED ORDER — DOXYCYCLINE HYCLATE 100 MG PO CAPS: 100.0000 mg | ORAL_CAPSULE | Freq: Two times a day (BID) | ORAL | Status: DC

## 2015-07-06 MED ORDER — BENZONATATE 200 MG PO CAPS: 200.0000 mg | ORAL_CAPSULE | Freq: Three times a day (TID) | ORAL | Status: DC | PRN

## 2015-07-06 MED ORDER — FLUTICASONE PROPIONATE 50 MCG/ACT NA SUSP: 1.0000 | Freq: Every day | NASAL | Status: DC

## 2015-07-06 NOTE — ED Provider Notes (Signed)
CSN: [id number]     Arrival date & time 07/06/15  1812 History   First MD Initiated Contact with Patient 07/06/15 1822     Chief Complaint  Patient presents with  . URI   (Consider location/radiation/quality/duration/timing/severity/associated sxs/prior Treatment) HPI Comments: [name] here with two toddlers started daycare sick frequently (viral cold, hand foot and mouth x 2) she works in [employer], mining office saw Dr Zenda Alpers 19 Jun 2015 given augmentin and nose sprays not helping sinusitis.  Cough keeping her up at night. Post nasal drip. Nasal congestion forehead and cheek pain.  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  Using qtips in ears denied exposure to loud noises work/home/hobbies.   Not breastfeeding.  Patient is a 34 y.o. female presenting with URI. The history is provided by the patient.  URI Presenting symptoms: congestion, cough, ear pain, facial pain, fatigue and rhinorrhea   Presenting symptoms: no fever and no sore throat   Congestion:    Location:  Nasal   Interferes with sleep: no     Interferes with eating/drinking: no   Cough:    Cough characteristics:  Productive   Sputum characteristics:  Yellow   Severity:  Mild   Onset quality:  Sudden   Duration:  4 weeks   Timing:  Intermittent   Progression:  Waxing and waning   Chronicity:  Chronic Ear pain:    Location:  Bilateral   Severity:  Mild   Onset quality:  Gradual   Progression:  Worsening   Chronicity:  Recurrent Rhinorrhea:    Quality:  Yellow   Severity:  Mild   Timing:  Intermittent   Progression:  Waxing and waning Severity:  Moderate Relieved by:  Nothing Worsened by:  Certain positions Ineffective treatments:  Drinking, rest, prescription medications, OTC medications, belching and breathing Associated symptoms: headaches and sinus pain   Associated symptoms: no arthralgias, no myalgias, no neck pain, no sneezing, no swollen glands and no wheezing   Headaches:    Severity:  Mild   Onset  quality:  Gradual   Progression:  Waxing and waning   Chronicity:  Recurrent Risk factors: diabetes mellitus, immunosuppression, recent illness and sick contacts   Risk factors: not elderly, no chronic cardiac disease, no chronic kidney disease, no chronic respiratory disease and no recent travel     Past Medical History  Diagnosis Date  . Decreased fetal movement in pregnancy in third trimester, antepartum 02/10/2015  . Uterine fibroids affecting pregnancy   . No pertinent past medical history   . Headache(784.0)     last one - 05/2011  . Missed abortion 12/2011    no surgery required  . Gestational diabetes    Past Surgical History  Procedure Laterality Date  . Cesarean section    . Myomectomy    . Leep    . Wisdom tooth extraction    . Robot assisted myomectomy  04/17/2012    Procedure: ROBOTIC ASSISTED MYOMECTOMY;  Surgeon: Princess Bruins, MD;  Location: [facility];  Service: Gynecology;  Laterality: N/A;  . Cesarean section N/A 01/27/2014    Procedure: CESAREAN SECTION;  Surgeon: Claiborne Billings A. Pamala Hurry, MD;  Location: [facility];  Service: Obstetrics;  Laterality: N/A;  . Cesarean section N/A 02/19/2015    Procedure: Repeat CESAREAN SECTION;  Surgeon: Brien Few, MD;  Location: [facility];  Service: Obstetrics;  Laterality: N/A;  EDD: 03/16/15   Family History  Problem Relation Age of Onset  . Hypertension Mother   . Diabetes Father   .  Cancer Father    Social History  Substance Use Topics  . Smoking status: Never Smoker   . Smokeless tobacco: Never Used  . Alcohol Use: No     Comment: rarely   OB History    Gravida Para Term Preterm AB TAB SAB Ectopic Multiple Living   6 2 1 1 1  0 1 0  2     Review of Systems  Constitutional: Positive for activity change and fatigue. Negative for fever, chills, diaphoresis and appetite change.  HENT: Positive for congestion, ear pain, postnasal drip, rhinorrhea and sinus pressure. Negative for dental problem, drooling, ear discharge, facial  swelling, hearing loss, mouth sores, nosebleeds, sneezing, sore throat, tinnitus, trouble swallowing and voice change.   Eyes: Negative for photophobia, pain, discharge, redness, itching and visual disturbance.  Respiratory: Positive for cough. Negative for choking, chest tightness, shortness of breath, wheezing and stridor.   Cardiovascular: Negative for chest pain, palpitations and leg swelling.  Gastrointestinal: Negative for nausea, vomiting, abdominal pain, diarrhea, constipation, blood in stool and abdominal distention.  Endocrine: Negative for cold intolerance and heat intolerance.  Genitourinary: Negative for dysuria and difficulty urinating.  Musculoskeletal: Negative for myalgias, back pain, joint swelling, arthralgias, gait problem and neck pain.  Skin: Negative for color change, pallor, rash and wound.  Allergic/Immunologic: Positive for immunocompromised state. Negative for environmental allergies and food allergies.  Neurological: Positive for headaches. Negative for dizziness, tremors, seizures, syncope, facial asymmetry, speech difficulty, weakness, light-headedness and numbness.  Hematological: Negative for adenopathy. Does not bruise/bleed easily.  Psychiatric/Behavioral: Positive for sleep disturbance. Negative for behavioral problems, confusion and agitation.    Allergies  Review of patient's allergies indicates no known allergies.  Home Medications   Prior to Admission medications   Medication Sig Start Date End Date Taking? Authorizing Provider  etonogestrel (NEXPLANON) 68 MG IMPL implant 1 each by Subdermal route once.   Yes Historical Provider, MD  Magnesium 250 MG TABS Take 1 tablet (250 mg total) by mouth daily. 02/22/15  Yes Artelia Laroche, CNM  metFORMIN (GLUCOPHAGE) 1000 MG tablet Take 1 tablet (1,000 mg total) by mouth 2 (two) times daily with a meal. 02/22/15  Yes Artelia Laroche, CNM  acetaminophen (TYLENOL) 500 MG tablet Take 500 mg by mouth every 6 (six) hours as  needed for mild pain, moderate pain or headache.    Historical Provider, MD  benzonatate (TESSALON) 200 MG capsule Take 1 capsule (200 mg total) by mouth 3 (three) times daily as needed for cough. 07/06/15   Olen Cordial, NP  doxycycline (VIBRAMYCIN) 100 MG capsule Take 1 capsule (100 mg total) by mouth 2 (two) times daily. 07/06/15   Olen Cordial, NP  fluticasone (FLONASE) 50 MCG/ACT nasal spray Place 1 spray into both nostrils daily. 07/06/15   Olen Cordial, NP  ibuprofen (ADVIL,MOTRIN) 600 MG tablet Take 1 tablet (600 mg total) by mouth every 6 (six) hours as needed for mild pain. 02/22/15   Artelia Laroche, CNM  sodium chloride (OCEAN) 0.65 % SOLN nasal spray Place 2 sprays into both nostrils every 2 (two) hours while awake. 07/06/15   Olen Cordial, NP   Meds Ordered and Administered this Visit  Medications - No data to display  BP 144/73 mmHg  Pulse 78  Temp(Src) 97.8 F (36.6 C) (Oral)  Resp 16  Ht 5\' 7"  (1.702 m)  Wt 260 lb (117.935 kg)  BMI 40.71 kg/m2  SpO2 98% No data found.   Physical Exam  Constitutional: She is  oriented to person, place, and time. Vital signs are normal. She appears well-developed and well-nourished. She is active and cooperative.  Non-toxic appearance. She does not have a sickly appearance. She appears ill. No distress.  HENT:  Head: Normocephalic and atraumatic.  Right Ear: Hearing, external ear and ear canal normal.  Left Ear: Hearing, external ear and ear canal normal.  Nose: Mucosal edema and rhinorrhea present. No nose lacerations, sinus tenderness, nasal deformity or septal deviation. Right sinus exhibits maxillary sinus tenderness and frontal sinus tenderness. Left sinus exhibits maxillary sinus tenderness and frontal sinus tenderness.  Mouth/Throat: Uvula is midline and mucous membranes are normal. Mucous membranes are not pale, not dry and not cyanotic. She does not have dentures. No oral lesions. No trismus in the jaw. Normal  dentition. No dental abscesses, uvula swelling, lacerations or dental caries. Posterior oropharyngeal edema and posterior oropharyngeal erythema present. No oropharyngeal exudate or tonsillar abscesses.  Bilateral TMs obscured by cerumen yellow brown approximate to TM canal without erythema/debris; bilateral nasal turbinates with edema/erythema/white discharge; cobblestoning posterior pharynx  Eyes: Conjunctivae, EOM and lids are normal. Pupils are equal, round, and reactive to light. Right eye exhibits no chemosis, no discharge, no exudate and no hordeolum. No foreign body present in the right eye. Left eye exhibits no chemosis, no discharge, no exudate and no hordeolum. No foreign body present in the left eye. Right conjunctiva is not injected. Right conjunctiva has no hemorrhage. Left conjunctiva is not injected. Left conjunctiva has no hemorrhage. No scleral icterus. Right eye exhibits normal extraocular motion and no nystagmus. Left eye exhibits normal extraocular motion and no nystagmus. Right pupil is round and reactive. Left pupil is round and reactive. Pupils are equal.  Neck: Trachea normal and normal range of motion. Neck supple. No spinous process tenderness and no muscular tenderness present. No rigidity. No tracheal deviation, no edema, no erythema and normal range of motion present. No thyroid mass and no thyromegaly present.  Cardiovascular: Normal rate, regular rhythm, S1 normal, S2 normal, normal heart sounds and intact distal pulses.  PMI is not displaced.  Exam reveals no gallop and no friction rub.   No murmur heard. Pulmonary/Chest: Effort normal and breath sounds normal. No accessory muscle usage or stridor. No respiratory distress. She has no decreased breath sounds. She has no wheezes. She has no rhonchi. She has no rales. She exhibits no tenderness.  egophany negative all fields; no cough observed in exam room  Abdominal: Soft. She exhibits no distension.  Musculoskeletal: Normal  range of motion. She exhibits no edema or tenderness.       Right shoulder: Normal.       Left shoulder: Normal.       Right elbow: Normal.      Left elbow: Normal.       Right hip: Normal.       Left hip: Normal.       Right knee: Normal.       Left knee: Normal.       Right ankle: Normal.       Left ankle: Normal.       Cervical back: Normal.       Thoracic back: Normal.       Lumbar back: Normal.       Right hand: Normal.       Left hand: Normal.  Lymphadenopathy:       Head (right side): No submental, no submandibular, no tonsillar, no preauricular, no posterior auricular and no occipital adenopathy  present.       Head (left side): No submental, no submandibular, no tonsillar, no preauricular, no posterior auricular and no occipital adenopathy present.    She has no cervical adenopathy.       Right cervical: No superficial cervical, no deep cervical and no posterior cervical adenopathy present.      Left cervical: No superficial cervical, no deep cervical and no posterior cervical adenopathy present.  Neurological: She is alert and oriented to person, place, and time. She has normal strength. She is not disoriented. She displays no atrophy and no tremor. No cranial nerve deficit or sensory deficit. She exhibits normal muscle tone. She displays no seizure activity. Coordination and gait normal. GCS eye subscore is 4. GCS verbal subscore is 5. GCS motor subscore is 6.  Skin: Skin is warm, dry and intact. No abrasion, no bruising, no burn, no ecchymosis, no laceration, no lesion, no petechiae and no rash noted. She is not diaphoretic. No erythema. No pallor.  Psychiatric: She has a normal mood and affect. Her speech is normal and behavior is normal. Judgment and thought content normal. Cognition and memory are normal.  Nursing note and vitals reviewed.   ED Course  Procedures (including critical care time)  Labs Review Labs Reviewed - No data to display  Imaging Review No results  found.  [place] completed ear irrigation with partial removal of cerumen.  Did not use curettage as adherent to TM bilaterally.  Patient to instill mineral oil or debrox drops OTC. Stop cotton applicator use in ears.  Use washcloth with 5th digit in shower to clear outer canal and ear/pinna.  Qtips are pushing wax up against TMs.  Patient verbalized understanding of information/instructions, agreed with plan of care and had no further questions at this time.  MDM   1. Chronic maxillary sinusitis   2. Cerumen impaction, bilateral   3. Acute bronchitis, unspecified organism    Failed augmentin with Dr Zenda Alpers 14 Oct and nasal saline/flonase.  Continue nasal saline and flonase start doxycyline 100mg  po BID x 10 days.  May use tylenol 1000mg  po QID prn pain.  No evidence of systemic bacterial infection, non toxic and well hydrated.  I do not see where any further testing or imaging is necessary at this time.   I will suggest supportive care, rest, good hygiene and encourage the patient to take adequate fluids.  The patient is to return to clinic or EMERGENCY ROOM if symptoms worsen or change significantly.  Exitcare handout on sinusitis given to patient.  Patient verbalized agreement and understanding of treatment plan and had no further questions at this time.   P2:  Hand washing and cover cough  Cerumen irrigation performed and incomplete clearing of earwax obtained by nurse utilizing syringe method.  Patient reported slight discomfort external ear canal after procedure, minor redness noted bilaterally and TMs intact without erythema.  Patient reported sounds are louder now.  Try mineral oil on cotton ball (soaked) weekly in ear for 20 minutes and when in clinic have provider check ears to see if buildup with this method again.  Discussed purpose of earwax with patient.  Avoid cotton applicator (Q-tip) use in ears.  Patient verbalized understanding, agreed with plan of care and had no further  questions at this time.    Post nasal drip started cough sinus drainage.  Tessalon pearles 200mg  po TID prn and doxycycline 100mg  po BID x 10 days.  Discussed honey with lemon, hydrating, steam  showers.  Bronchitis simple, community acquired, may have started as viral (probably respiratory syncytial, parainfluenza, influenza, or adenovirus), but now evidence of acute purulent bronchitis with resultant bronchial edema and mucus formation.  Viruses are the most common cause of bronchial inflammation in otherwise healthy adults with acute bronchitis.  The appearance of sputum is not predictive of whether a bacterial infection is present.  Purulent sputum is most often caused by viral infections.  There are a small portion of those caused by non-viral agents being Mycoplama pneumonia.  Microscopic examination or C&S of sputum in the healthy adult with acute bronchitis is generally not helpful (usually negative or normal respiratory flora) other considerations being cough from upper respiratory tract infections, sinusitis or allergic syndromes (mild asthma or viral pneumonia).  Differential Diagnoses:  reactive airway disease (asthma, allergic aspergillosis (eosinophilia), chronic bronchitis, respiratory infection (sinusitis, common cold, pneumonia), congestive heart failure, reflux esophagitis, bronchogenic tumor, aspiration syndromes and/or exposure to pulmonary irritants/smoke.  I will order Doxycycline 100mg  two times a day for ten days for possible Mycoplamsa.  Without high fever, severe dyspnea, lack of physical findings or other risk factors, I will hold on a chest radiograph and CBC at this time.  I discussed that approximately 50% of patients with acute bronchitis have a cough that lasts up to three weeks, and 25% for over a month.  Tylenol, one to two tablets every four hours as needed for fever or myalgias.  No aspirin.  Patient instructed to follow up in one week or sooner if symptoms worsen.  Patient  verbalized agreement and understanding of treatment plan.  P2:  hand washing and cover cough   Olen Cordial, NP 07/06/15 1932

## 2015-07-06 NOTE — ED Notes (Signed)
Productive cough, congestion, fatigue, nasal congestion x 4 weeks. Pt reports her cough is deep and barky. Denies fever.

## 2015-07-06 NOTE — Discharge Instructions (Signed)
Sinusitis, Adult °Sinusitis is redness, soreness, and inflammation of the paranasal sinuses. Paranasal sinuses are air pockets within the bones of your face. They are located beneath your eyes, in the middle of your forehead, and above your eyes. In healthy paranasal sinuses, mucus is able to drain out, and air is able to circulate through them by way of your nose. However, when your paranasal sinuses are inflamed, mucus and air can become trapped. This can allow bacteria and other germs to grow and cause infection. °Sinusitis can develop quickly and last only a short time (acute) or continue over a long period (chronic). Sinusitis that lasts for more than 12 weeks is considered chronic. °CAUSES °Causes of sinusitis include: °· Allergies. °· Structural abnormalities, such as displacement of the cartilage that separates your nostrils (deviated septum), which can decrease the air flow through your nose and sinuses and affect sinus drainage. °· Functional abnormalities, such as when the small hairs (cilia) that line your sinuses and help remove mucus do not work properly or are not present. °SIGNS AND SYMPTOMS °Symptoms of acute and chronic sinusitis are the same. The primary symptoms are pain and pressure around the affected sinuses. Other symptoms include: °· Upper toothache. °· Earache. °· Headache. °· Bad breath. °· Decreased sense of smell and taste. °· A cough, which worsens when you are lying flat. °· Fatigue. °· Fever. °· Thick drainage from your nose, which often is green and may contain pus (purulent). °· Swelling and warmth over the affected sinuses. °DIAGNOSIS °Your health care provider will perform a physical exam. During your exam, your health care provider may perform any of the following to help determine if you have acute sinusitis or chronic sinusitis: °· Look in your nose for signs of abnormal growths in your nostrils (nasal polyps). °· Tap over the affected sinus to check for signs of  infection. °· View the inside of your sinuses using an imaging device that has a light attached (endoscope). °If your health care provider suspects that you have chronic sinusitis, one or more of the following tests may be recommended: °· Allergy tests. °· Nasal culture. A sample of mucus is taken from your nose, sent to a lab, and screened for bacteria. °· Nasal cytology. A sample of mucus is taken from your nose and examined by your health care provider to determine if your sinusitis is related to an allergy. °TREATMENT °Most cases of acute sinusitis are related to a viral infection and will resolve on their own within 10 days. Sometimes, medicines are prescribed to help relieve symptoms of both acute and chronic sinusitis. These may include pain medicines, decongestants, nasal steroid sprays, or saline sprays. °However, for sinusitis related to a bacterial infection, your health care provider will prescribe antibiotic medicines. These are medicines that will help kill the bacteria causing the infection. °Rarely, sinusitis is caused by a fungal infection. In these cases, your health care provider will prescribe antifungal medicine. °For some cases of chronic sinusitis, surgery is needed. Generally, these are cases in which sinusitis recurs more than 3 times per year, despite other treatments. °HOME CARE INSTRUCTIONS °· Drink plenty of water. Water helps thin the mucus so your sinuses can drain more easily. °· Use a humidifier. °· Inhale steam 3-4 times a day (for example, sit in the bathroom with the shower running). °· Apply a warm, moist washcloth to your face 3-4 times a day, or as directed by your health care provider. °· Use saline nasal sprays to help   moisten and clean your sinuses.  Take medicines only as directed by your health care provider.  If you were prescribed either an antibiotic or antifungal medicine, finish it all even if you start to feel better. SEEK IMMEDIATE MEDICAL CARE IF:  You have  increasing pain or severe headaches.  You have nausea, vomiting, or drowsiness.  You have swelling around your face.  You have vision problems.  You have a stiff neck.  You have difficulty breathing.   This information is not intended to replace advice given to you by your health care provider. Make sure you discuss any questions you have with your health care provider.   Document Released: 08/22/2005 Document Revised: 09/12/2014 Document Reviewed: 09/06/2011 Elsevier Interactive Patient Education 2016 [employer] The structures of the external ear canal secrete a waxy substance known as cerumen. Excess cerumen can build up in the ear canal, causing a condition known as cerumen impaction. Cerumen impaction can cause ear pain and disrupt the function of the ear. The rate of cerumen production differs for each individual. In certain individuals, the configuration of the ear canal may decrease his or her ability to naturally remove cerumen. CAUSES Cerumen impaction is caused by excessive cerumen production or buildup. RISK FACTORS  Frequent use of swabs to clean ears.  Having narrow ear canals.  Having eczema.  Being dehydrated. SIGNS AND SYMPTOMS  Diminished hearing.  Ear drainage.  Ear pain.  Ear itch. TREATMENT Treatment may involve:  Over-the-counter or prescription ear drops to soften the cerumen.  Removal of cerumen by a health care provider. This may be done with:  Irrigation with warm water. This is the most common method of removal.  Ear curettes and other instruments.  Surgery. This may be done in severe cases. HOME CARE INSTRUCTIONS  Take medicines only as directed by your health care provider.  Do not insert objects into the ear with the intent of cleaning the ear. PREVENTION  Do not insert objects into the ear, even with the intent of cleaning the ear. Removing cerumen as a part of normal hygiene is not necessary, and the use of  swabs in the ear canal is not recommended.  Drink enough water to keep your urine clear or pale yellow.  Control your eczema if you have it. SEEK MEDICAL CARE IF:  You develop ear pain.  You develop bleeding from the ear.  The cerumen does not clear after you use ear drops as directed.   This information is not intended to replace advice given to you by your health care provider. Make sure you discuss any questions you have with your health care provider.   Document Released: 09/29/2004 Document Revised: 09/12/2014 Document Reviewed: 04/08/2015 Elsevier Interactive Patient Education 2016 [employer]  Acute Bronchitis Bronchitis is inflammation of the airways that extend from the windpipe into the lungs (bronchi). The inflammation often causes mucus to develop. This leads to a cough, which is the most common symptom of bronchitis.  In acute bronchitis, the condition usually develops suddenly and goes away over time, usually in a couple weeks. Smoking, allergies, and asthma can make bronchitis worse. Repeated episodes of bronchitis may cause further lung problems.  CAUSES Acute bronchitis is most often caused by the same virus that causes a cold. The virus can spread from person to person (contagious) through coughing, sneezing, and touching contaminated objects. SIGNS AND SYMPTOMS   Cough.   Fever.   Coughing up mucus.   Body aches.  Chest congestion.   Chills.   Shortness of breath.   Sore throat.  DIAGNOSIS  Acute bronchitis is usually diagnosed through a physical exam. Your health care provider will also ask you questions about your medical history. Tests, such as chest X-rays, are sometimes done to rule out other conditions.  TREATMENT  Acute bronchitis usually goes away in a couple weeks. Oftentimes, no medical treatment is necessary. Medicines are sometimes given for relief of fever or cough. Antibiotic medicines are usually not needed but may be prescribed  in certain situations. In some cases, an inhaler may be recommended to help reduce shortness of breath and control the cough. A cool mist vaporizer may also be used to help thin bronchial secretions and make it easier to clear the chest.  HOME CARE INSTRUCTIONS  Get plenty of rest.   Drink enough fluids to keep your urine clear or pale yellow (unless you have a medical condition that requires fluid restriction). Increasing fluids may help thin your respiratory secretions (sputum) and reduce chest congestion, and it will prevent dehydration.   Take medicines only as directed by your health care provider.  If you were prescribed an antibiotic medicine, finish it all even if you start to feel better.  Avoid smoking and secondhand smoke. Exposure to cigarette smoke or irritating chemicals will make bronchitis worse. If you are a smoker, consider using nicotine gum or skin patches to help control withdrawal symptoms. Quitting smoking will help your lungs heal faster.   Reduce the chances of another bout of acute bronchitis by washing your hands frequently, avoiding people with cold symptoms, and trying not to touch your hands to your mouth, nose, or eyes.   Keep all follow-up visits as directed by your health care provider.  SEEK MEDICAL CARE IF: Your symptoms do not improve after 1 week of treatment.  SEEK IMMEDIATE MEDICAL CARE IF:  You develop an increased fever or chills.   You have chest pain.   You have severe shortness of breath.  You have bloody sputum.   You develop dehydration.  You faint or repeatedly feel like you are going to pass out.  You develop repeated vomiting.  You develop a severe headache. MAKE SURE YOU:   Understand these instructions.  Will watch your condition.  Will get help right away if you are not doing well or get worse.   This information is not intended to replace advice given to you by your health care provider. Make sure you discuss any  questions you have with your health care provider.   Document Released: 09/29/2004 Document Revised: 09/12/2014 Document Reviewed: 02/12/2013 Elsevier Interactive Patient Education [employer].

## 2016-04-06 ENCOUNTER — Ambulatory Visit
Admission: EM | Admit: 2016-04-06 | Discharge: 2016-04-06 | Disposition: A | Discharge status: Home / Self Care | Payer: BLUE CROSS/BLUE SHIELD | Attending: Family Medicine | Admitting: Family Medicine

## 2016-04-06 ENCOUNTER — Encounter: Payer: Self-pay | Admitting: Emergency Medicine

## 2016-04-06 DIAGNOSIS — J069 Acute upper respiratory infection, unspecified: Secondary | ICD-10-CM

## 2016-04-06 MED ORDER — ALBUTEROL SULFATE HFA 108 (90 BASE) MCG/ACT IN AERS: 1.0000 | INHALATION_SPRAY | Freq: Four times a day (QID) | RESPIRATORY_TRACT | 0 refills | Status: DC | PRN

## 2016-04-06 MED ORDER — AZITHROMYCIN 250 MG PO TABS: ORAL_TABLET | ORAL | 0 refills | Status: DC

## 2016-04-06 NOTE — ED Triage Notes (Signed)
Patient c/o cough and chest congestion for the past 5 days.  Patient reports fever and chills.

## 2016-04-06 NOTE — ED Provider Notes (Signed)
[facility]    CSN: [id number] Arrival date & time: 04/06/16  1314  First Provider Contact:  None       History   Chief Complaint Chief Complaint  Patient presents with  . Cough    HPI Kimberly Campbell is a 35 y.o. female.   HPI: She presents today with symptoms of mild productive cough, postnasal drip, wheezing, sore throat. Patient states that she's had the symptoms for a few days. She has had some colored mucus. She denies any history of COPD or asthma. She denies chest pain or shortness of breath or lower extremity edema or pain. She has tried some over-the-counter medications for his symptoms.  Past Medical History:  Diagnosis Date  . Decreased fetal movement in pregnancy in third trimester, antepartum 02/10/2015  . Gestational diabetes   . Headache(784.0)    last one - 05/2011  . Missed abortion 12/2011   no surgery required  . No pertinent past medical history   . Uterine fibroids affecting pregnancy     Patient Active Problem List   Diagnosis Date Noted  . Pregnancy 02/22/2015  . Pregnancy w/ hx of uterine myomectomy 02/19/2015  . Previous cesarean section 02/19/2015  . GDM, class A2-insulin 02/19/2015  . Thrombocytopenia affecting pregnancy, antepartum ([place]) 02/19/2015  . Postpartum care following cesarean delivery (6/16) 02/19/2015  . Decreased fetal movement in pregnancy in third trimester, antepartum 02/10/2015    Past Surgical History:  Procedure Laterality Date  . CESAREAN SECTION    . CESAREAN SECTION N/A 01/27/2014   Procedure: CESAREAN SECTION;  Surgeon: Claiborne Billings A. Pamala Hurry, MD;  Location: [facility];  Service: Obstetrics;  Laterality: N/A;  . CESAREAN SECTION N/A 02/19/2015   Procedure: Repeat CESAREAN SECTION;  Surgeon: Brien Few, MD;  Location: [facility];  Service: Obstetrics;  Laterality: N/A;  EDD: 03/16/15  . LEEP    . MYOMECTOMY    . ROBOT ASSISTED MYOMECTOMY  04/17/2012   Procedure: ROBOTIC ASSISTED MYOMECTOMY;  Surgeon: Princess Bruins, MD;  Location: [facility];  Service: Gynecology;  Laterality: N/A;  . WISDOM TOOTH EXTRACTION      OB History    Gravida Para Term Preterm AB Living   6 2 1 1 1 2    SAB TAB Ectopic Multiple Live Births   1 0 0   2       Home Medications    Prior to Admission medications   Medication Sig Start Date End Date Taking? Authorizing Provider  acetaminophen (TYLENOL) 500 MG tablet Take 500 mg by mouth every 6 (six) hours as needed for mild pain, moderate pain or headache.    Historical Provider, MD  etonogestrel (NEXPLANON) 68 MG IMPL implant 1 each by Subdermal route once.    Historical Provider, MD  ibuprofen (ADVIL,MOTRIN) 600 MG tablet Take 1 tablet (600 mg total) by mouth every 6 (six) hours as needed for mild pain. 02/22/15   Artelia Laroche, CNM    Family History Family History  Problem Relation Age of Onset  . Hypertension Mother   . Diabetes Father   . Cancer Father     Social History Social History  Substance Use Topics  . Smoking status: Never Smoker  . Smokeless tobacco: Never Used  . Alcohol use No     Comment: rarely     Allergies   Review of patient's allergies indicates no known allergies.   Review of Systems Review of Systems : Negative except mentioned above.  Physical Exam Triage Vital Signs ED  Triage Vitals  Enc Vitals Group     BP 04/06/16 1333 121/82     Pulse Rate 04/06/16 1333 100     Resp 04/06/16 1333 16     Temp 04/06/16 1333 98.1 F (36.7 C)     Temp Source 04/06/16 1333 Oral     SpO2 04/06/16 1333 98 %     Weight 04/06/16 1333 230 lb (104.3 kg)     Height 04/06/16 1333 5\' 7"  (1.702 m)     Head Circumference --      Peak Flow --      Pain Score 04/06/16 1335 1     Pain Loc --      Pain Edu? --      Excl. in [place]? --    No data found.   Updated Vital Signs BP 121/82 (BP Location: Left Arm)   Pulse 100   Temp 98.1 F (36.7 C) (Oral)   Resp 16   Ht 5\' 7"  (1.702 m)   Wt 230 lb (104.3 kg)   SpO2 98%   BMI 36.02 kg/m    Physical Exam:  GENERAL: NAD HEENT: mild pharyngeal erythema, no exudate, no erythema of TMs, no cervical LAD RESP: mild right expiratory wheezing, no accessory muscle use, normal respiratory rate CARD: RRR NEURO: CN II-XII grossly intact    UC Treatments / Results  Labs (all labs ordered are listed, but only abnormal results are displayed) Labs Reviewed - No data to display  EKG  EKG Interpretation None       Radiology No results found.  Procedures Procedures (including critical care time)  Medications Ordered in UC Medications - No data to display   Initial Impression / Assessment and Plan / UC Course  I have reviewed the triage vital signs and the nursing notes.  Pertinent labs & imaging results that were available during my care of the patient were reviewed by me and considered in my medical decision making (see chart for details).  Clinical Course   A/P: URI- patient given prescription for Z-Pak, oral antihistamine when necessary, albuterol when necessary, rest, hydration, cough suppressant when necessary, seek medical attention if symptoms persist or worsen.  Final Clinical Impressions(s) / UC Diagnoses   Final diagnoses:  None    New Prescriptions New Prescriptions   No medications on file     Paulina Fusi, MD 04/06/16 [id number]

## 2016-07-19 ENCOUNTER — Encounter: Payer: Self-pay | Admitting: Emergency Medicine

## 2016-07-19 ENCOUNTER — Ambulatory Visit
Admission: EM | Admit: 2016-07-19 | Discharge: 2016-07-19 | Disposition: A | Discharge status: Home / Self Care | Payer: BLUE CROSS/BLUE SHIELD | Attending: Family Medicine | Admitting: Family Medicine

## 2016-07-19 DIAGNOSIS — H6123 Impacted cerumen, bilateral: Secondary | ICD-10-CM | POA: Diagnosis not present

## 2016-07-19 DIAGNOSIS — H1031 Unspecified acute conjunctivitis, right eye: Secondary | ICD-10-CM

## 2016-07-19 MED ORDER — MOXIFLOXACIN HCL 0.5 % OP SOLN: 1.0000 [drp] | Freq: Three times a day (TID) | OPHTHALMIC | 0 refills | Status: DC

## 2016-07-19 NOTE — ED Provider Notes (Signed)
CSN: [id number]     Arrival date & time 07/19/16  [id number] History   First MD Initiated Contact with Patient 07/19/16 1001     Chief Complaint  Patient presents with  . Eye Problem  . Otalgia   (Consider location/radiation/quality/duration/timing/severity/associated sxs/prior Treatment) HPI  A 35 year old female who wears contact lenses as with a red painful right eye some drainage. She states that that it was not matted together. She had a similar incident to prior was given antibiotic drops and improved. Been having some right ear discomfort. Had any sinus issues. Some mild photosensitivity in the right eye. She is not wearing her contacts today.      Past Medical History:  Diagnosis Date  . Decreased fetal movement in pregnancy in third trimester, antepartum 02/10/2015  . Gestational diabetes   . Headache(784.0)    last one - 05/2011  . Missed abortion 12/2011   no surgery required  . No pertinent past medical history   . Uterine fibroids affecting pregnancy    Past Surgical History:  Procedure Laterality Date  . CESAREAN SECTION    . CESAREAN SECTION N/A 01/27/2014   Procedure: CESAREAN SECTION;  Surgeon: Claiborne Billings A. Pamala Hurry, MD;  Location: [facility];  Service: Obstetrics;  Laterality: N/A;  . CESAREAN SECTION N/A 02/19/2015   Procedure: Repeat CESAREAN SECTION;  Surgeon: Brien Few, MD;  Location: [facility];  Service: Obstetrics;  Laterality: N/A;  EDD: 03/16/15  . LEEP    . MYOMECTOMY    . ROBOT ASSISTED MYOMECTOMY  04/17/2012   Procedure: ROBOTIC ASSISTED MYOMECTOMY;  Surgeon: Princess Bruins, MD;  Location: [facility];  Service: Gynecology;  Laterality: N/A;  . WISDOM TOOTH EXTRACTION     Family History  Problem Relation Age of Onset  . Hypertension Mother   . Diabetes Father   . Cancer Father    Social History  Substance Use Topics  . Smoking status: Never Smoker  . Smokeless tobacco: Never Used  . Alcohol use No     Comment: rarely   OB History    Gravida Para Term  Preterm AB Living   6 2 1 1 1 2    SAB TAB Ectopic Multiple Live Births   1 0 0   2     Review of Systems  Constitutional: Positive for activity change. Negative for chills, fatigue and fever.  HENT: Positive for congestion and ear pain.   Eyes: Positive for photophobia, pain, discharge, redness and visual disturbance.  All other systems reviewed and are negative.   Allergies  Patient has no known allergies.  Home Medications   Prior to Admission medications   Medication Sig Start Date End Date Taking? Authorizing Provider  acetaminophen (TYLENOL) 500 MG tablet Take 500 mg by mouth every 6 (six) hours as needed for mild pain, moderate pain or headache.    Historical Provider, MD  albuterol (PROVENTIL HFA;VENTOLIN HFA) 108 (90 Base) MCG/ACT inhaler Inhale 1-2 puffs into the lungs every 6 (six) hours as needed for wheezing or shortness of breath. 04/06/16   Paulina Fusi, MD  etonogestrel (NEXPLANON) 68 MG IMPL implant 1 each by Subdermal route once.    Historical Provider, MD  ibuprofen (ADVIL,MOTRIN) 600 MG tablet Take 1 tablet (600 mg total) by mouth every 6 (six) hours as needed for mild pain. 02/22/15   Artelia Laroche, CNM  moxifloxacin (VIGAMOX) 0.5 % ophthalmic solution Place 1 drop into the right eye 3 (three) times daily. 07/19/16   Lorin Picket, PA-C  Meds Ordered and Administered this Visit  Medications - No data to display  BP 128/90 (BP Location: Left Arm)   Pulse 97   Temp 98.3 F (36.8 C) (Oral)   Resp 16   Ht 5\' 7"  (1.702 m)   Wt 230 lb (104.3 kg)   SpO2 98%   Breastfeeding? No   BMI 36.02 kg/m  No data found.   Physical Exam  Constitutional: She is oriented to person, place, and time. She appears well-developed and well-nourished. No distress.  HENT:  Head: Normocephalic and atraumatic.  Both ears are occluded with cerumen  Eyes: EOM are normal. Pupils are equal, round, and reactive to light. Right eye exhibits no discharge. Left eye exhibits no  discharge.  Semination of the right eye shows erythema and some clear discharge present. Right accommodation EOMs are full and intact. The right upper lid was everted with no findings of any foreign material. After explaining the procedure to the patient she consented to have her eye anesthetized and 2 drops of tetracaine were instilled. Fluoresceins stain was then utilized and examined under [employer] with no abrasions or ulcers of the cornea.  Neck: Normal range of motion. Neck supple.  Musculoskeletal: Normal range of motion.  Lymphadenopathy:    She has no cervical adenopathy.  Neurological: She is alert and oriented to person, place, and time.  Skin: Skin is warm and dry. She is not diaphoretic.  Psychiatric: She has a normal mood and affect. Her behavior is normal. Judgment and thought content normal.  Nursing note and vitals reviewed.   Urgent Care Course   Clinical Course     Procedures (including critical care time)  Labs Review Labs Reviewed - No data to display  Imaging Review No results found.   Visual Acuity Review  Right Eye Distance: 20/15 corrected Left Eye Distance: 20/15 corrected Bilateral Distance:    Right Eye Near:   Left Eye Near:    Bilateral Near:      Bilateral ear Lavage was performed   MDM   1. Acute bacterial conjunctivitis of right eye    New Prescriptions   MOXIFLOXACIN (VIGAMOX) 0.5 % OPHTHALMIC SOLUTION    Place 1 drop into the right eye 3 (three) times daily.  Plan: 1. Test/x-ray results and diagnosis reviewed with patient 2. rx as per orders; risks, benefits, potential side effects reviewed with patient 3. Recommend supportive treatment with Use of eyedrops as directed. If she is not improving she in 24-48 hours she will follow-up with ophthalmology. Do not wear contacts for 7 days. 4. F/u prn if symptoms worsen or don't improve     Lorin Picket, PA-C 07/19/16 1056

## 2016-07-19 NOTE — ED Triage Notes (Signed)
Patient c/o right eye redness and pain that started yesterday.  Patient c/o right ear pain since Thursday.

## 2016-07-22 ENCOUNTER — Telehealth: Payer: Self-pay | Admitting: *Deleted

## 2016-07-22 NOTE — Telephone Encounter (Signed)
Courtesy call back, no answer, left message on answering machine to call back if any questions or concerns should arise.

## 2016-11-24 ENCOUNTER — Ambulatory Visit
Admission: EM | Admit: 2016-11-24 | Discharge: 2016-11-24 | Disposition: A | Discharge status: Home / Self Care | Payer: BLUE CROSS/BLUE SHIELD | Attending: Family Medicine | Admitting: Family Medicine

## 2016-11-24 DIAGNOSIS — R05 Cough: Secondary | ICD-10-CM | POA: Diagnosis not present

## 2016-11-24 DIAGNOSIS — J0100 Acute maxillary sinusitis, unspecified: Secondary | ICD-10-CM

## 2016-11-24 DIAGNOSIS — J018 Other acute sinusitis: Secondary | ICD-10-CM

## 2016-11-24 DIAGNOSIS — J069 Acute upper respiratory infection, unspecified: Secondary | ICD-10-CM | POA: Diagnosis not present

## 2016-11-24 MED ORDER — HYDROCOD POLST-CPM POLST ER 10-8 MG/5ML PO SUER: 5.0000 mL | Freq: Every evening | ORAL | 0 refills | Status: DC | PRN

## 2016-11-24 MED ORDER — BENZONATATE 100 MG PO CAPS: 100.0000 mg | ORAL_CAPSULE | Freq: Three times a day (TID) | ORAL | 0 refills | Status: DC | PRN

## 2016-11-24 MED ORDER — DOXYCYCLINE HYCLATE 100 MG PO CAPS: 100.0000 mg | ORAL_CAPSULE | Freq: Two times a day (BID) | ORAL | 0 refills | Status: DC

## 2016-11-24 NOTE — ED Provider Notes (Signed)
[facility] ____________________________________________  Time seen: Approximately 8:08 PM  I have reviewed the triage vital signs and the nursing notes.   HISTORY  Chief Complaint Cough   HPI Kimberly Campbell is a 36 y.o. female presenting for the complaints of 8 days of runny nose, nasal congestion, intermittent sore throat and cough. Reports nasal congestion and sinus pressure has gradually increased. Patient reports yesterday she had some much postnasal drainage she vomited. Denies any other vomiting. Denies abdominal pain. Reports sinuses around her cheeks feel clogged with pressure. Reports cough intermittently waking her up at night. Reports symptoms unresolved with multiple over-the-counter cough and congestion medications. States possible fever first 2 days of symptom onset, denies other fevers. Reports overall continues to drink fluids well, slight decrease in appetite.  Denies chest pain, shortness of breath, abdominal pain, dysuria, extremity pain, extremity swelling or rash. Denies recent sickness. Denies recent antibiotic use.   No LMP recorded. Patient has had an implant. Denies pregnancy. Denies breast-feeding.   Past Medical History:  Diagnosis Date  . Decreased fetal movement in pregnancy in third trimester, antepartum 02/10/2015  . Gestational diabetes   . Headache(784.0)    last one - 05/2011  . Missed abortion 12/2011   no surgery required  . No pertinent past medical history   . Uterine fibroids affecting pregnancy     Patient Active Problem List   Diagnosis Date Noted  . Pregnancy 02/22/2015  . Pregnancy w/ hx of uterine myomectomy 02/19/2015  . Previous cesarean section 02/19/2015  . GDM, class A2-insulin 02/19/2015  . Thrombocytopenia affecting pregnancy, antepartum ([place]) 02/19/2015  . Postpartum care following cesarean delivery (6/16) 02/19/2015  . Decreased fetal movement in pregnancy in third trimester, antepartum 02/10/2015    Past  Surgical History:  Procedure Laterality Date  . CESAREAN SECTION    . CESAREAN SECTION N/A 01/27/2014   Procedure: CESAREAN SECTION;  Surgeon: Claiborne Billings A. Pamala Hurry, MD;  Location: [facility];  Service: Obstetrics;  Laterality: N/A;  . CESAREAN SECTION N/A 02/19/2015   Procedure: Repeat CESAREAN SECTION;  Surgeon: Brien Few, MD;  Location: [facility];  Service: Obstetrics;  Laterality: N/A;  EDD: 03/16/15  . LEEP    . MYOMECTOMY    . ROBOT ASSISTED MYOMECTOMY  04/17/2012   Procedure: ROBOTIC ASSISTED MYOMECTOMY;  Surgeon: Princess Bruins, MD;  Location: [facility];  Service: Gynecology;  Laterality: N/A;  . WISDOM TOOTH EXTRACTION       No current facility-administered medications for this encounter.   Current Outpatient Prescriptions:  .  acetaminophen (TYLENOL) 500 MG tablet, Take 500 mg by mouth every 6 (six) hours as needed for mild pain, moderate pain or headache., Disp: , Rfl:  .  albuterol (PROVENTIL HFA;VENTOLIN HFA) 108 (90 Base) MCG/ACT inhaler, Inhale 1-2 puffs into the lungs every 6 (six) hours as needed for wheezing or shortness of breath., Disp: 1 Inhaler, Rfl: 0 .  benzonatate (TESSALON PERLES) 100 MG capsule, Take 1 capsule (100 mg total) by mouth 3 (three) times daily as needed for cough., Disp: 15 capsule, Rfl: 0 .  chlorpheniramine-HYDROcodone (TUSSIONEX PENNKINETIC ER) 10-8 MG/5ML SUER, Take 5 mLs by mouth at bedtime as needed for cough. do not drive or operate machinery while taking as can cause drowsiness., Disp: 75 mL, Rfl: 0 .  doxycycline (VIBRAMYCIN) 100 MG capsule, Take 1 capsule (100 mg total) by mouth 2 (two) times daily., Disp: 20 capsule, Rfl: 0 .  etonogestrel (NEXPLANON) 68 MG IMPL implant, 1 each by Subdermal route once., Disp: ,  Rfl:  .  ibuprofen (ADVIL,MOTRIN) 600 MG tablet, Take 1 tablet (600 mg total) by mouth every 6 (six) hours as needed for mild pain., Disp: 30 tablet, Rfl: 0 .  moxifloxacin (VIGAMOX) 0.5 % ophthalmic solution, Place 1 drop into the right eye 3  (three) times daily., Disp: 3 mL, Rfl: 0  Allergies Patient has no known allergies.  Family History  Problem Relation Age of Onset  . Hypertension Mother   . Diabetes Father   . Cancer Father     Social History Social History  Substance Use Topics  . Smoking status: Never Smoker  . Smokeless tobacco: Never Used  . Alcohol use No     Comment: rarely    Review of Systems Constitutional: As above.  Eyes: No visual changes. ENT: As above. Cardiovascular: Denies chest pain. Respiratory: Denies shortness of breath. Gastrointestinal: No abdominal pain. No diarrhea.  No constipation. Genitourinary: Negative for dysuria. Musculoskeletal: Negative for back pain. Skin: Negative for rash. Neurological: Negative for headaches, focal weakness or numbness.  10-point ROS otherwise negative.  ____________________________________________   PHYSICAL EXAM:  VITAL SIGNS: ED Triage Vitals  Enc Vitals Group     BP 11/24/16 1937 (!) 143/95     Pulse Rate 11/24/16 1937 96     Resp 11/24/16 1937 18     Temp 11/24/16 1937 98.5 F (36.9 C)     Temp Source 11/24/16 1937 Oral     SpO2 11/24/16 1937 100 %     Weight 11/24/16 1935 230 lb (104.3 kg)     Height 11/24/16 1935 5\' 7"  (1.702 m)     Head Circumference --      Peak Flow --      Pain Score 11/24/16 1937 7     Pain Loc --      Pain Edu? --      Excl. in [place]? --     Constitutional: Alert and oriented. Well appearing and in no acute distress. Eyes: Conjunctivae are normal. PERRL. EOMI. Head: Atraumatic.Mild to moderate tenderness to palpation bilateral maxillary sinuses, Mild tenderness to palpation bilateral frontal sinuses.. No swelling. No erythema.   Ears: no erythema, normal TMs bilaterally.   Nose: nasal congestion with bilateral nasal turbinate erythema and edema.   Mouth/Throat: Mucous membranes are moist.  Oropharynx non-erythematous.No tonsillar swelling or exudate.  Neck: No stridor.  No cervical spine tenderness to  palpation. Hematological/Lymphatic/Immunilogical: No cervical lymphadenopathy. Cardiovascular: Normal rate, regular rhythm. Grossly normal heart sounds.  Good peripheral circulation. Respiratory: Normal respiratory effort.  No retractions. No wheezes, rales or rhonchi. Good air movement.  Gastrointestinal: Soft and nontender. No CVA tenderness. Musculoskeletal: No lower extremity edema.No cervical, thoracic or lumbar tenderness to palpation.  Neurologic:  Normal speech and language.No gait instability. Skin:  Skin is warm, dry and intact. No rash noted. Psychiatric: Mood and affect are normal. Speech and behavior are normal.  ___________________________________________   LABS (all labs ordered are listed, but only abnormal results are displayed)  Labs Reviewed - No data to display ____________________________________________   PROCEDURES Procedures    INITIAL IMPRESSION / ASSESSMENT AND PLAN / ED COURSE  Pertinent labs & imaging results that were available during my care of the patient were reviewed by me and considered in my medical decision making (see chart for details).   Well-appearing patient. No acute distress. Discussed in detail with patient suspect upper respiratory infection with cough and sinusitis. Discussed antibiotic stewardship and no clear source of bacterial infection at this time. Discussed  the patient will treat patient symptoms with over-the-counter Sudafed,. Tessalon Perles and when necessary Tussionex at night. Discussed with patient we'll give Rx for oral doxycycline and encouraged patient to not fill for 2-3 more days and then feel his symptoms continue. Encourage reevaluation for any worsening concerns. Patient agrees this plan. Discussed indication, risks and benefits of medications with patient.  Discussed follow up with Primary care physician this week. Discussed follow up and return parameters including no resolution or any worsening concerns. Patient  verbalized understanding and agreed to plan.   ____________________________________________   FINAL CLINICAL IMPRESSION(S) / ED DIAGNOSES  Final diagnoses:  URI with cough and congestion  Acute maxillary sinusitis, recurrence not specified     Discharge Medication List as of 11/24/2016  8:23 PM    START taking these medications   Details  benzonatate (TESSALON PERLES) 100 MG capsule Take 1 capsule (100 mg total) by mouth 3 (three) times daily as needed for cough., Starting Thu 11/24/2016, Normal    chlorpheniramine-HYDROcodone (TUSSIONEX PENNKINETIC ER) 10-8 MG/5ML SUER Take 5 mLs by mouth at bedtime as needed for cough. do not drive or operate machinery while taking as can cause drowsiness., Starting Thu 11/24/2016, Print    doxycycline (VIBRAMYCIN) 100 MG capsule Take 1 capsule (100 mg total) by mouth 2 (two) times daily., Starting Thu 11/24/2016, Print        Note: This dictation was prepared with Dragon dictation along with smaller phrase technology. Any transcriptional errors that result from this process are unintentional.         Marylene Land, NP 11/24/16 1771    Marylene Land, NP 11/24/16 2106

## 2016-11-24 NOTE — ED Triage Notes (Signed)
Patient complains of cough, congestion, irritated throat, sinus pain and pressure. Patient states that she has tried and failed mulitple OTC medications.

## 2016-11-24 NOTE — Discharge Instructions (Signed)
Take medication as prescribed. Rest. Drink plenty of fluids.  ° °Follow up with your primary care physician this week as needed. Return to Urgent care for new or worsening concerns.  ° °

## 2017-04-19 ENCOUNTER — Encounter: Payer: Self-pay | Admitting: Emergency Medicine

## 2017-04-19 ENCOUNTER — Ambulatory Visit
Admission: EM | Admit: 2017-04-19 | Discharge: 2017-04-19 | Disposition: A | Discharge status: Home / Self Care | Payer: BLUE CROSS/BLUE SHIELD | Attending: Family Medicine | Admitting: Family Medicine

## 2017-04-19 DIAGNOSIS — H6691 Otitis media, unspecified, right ear: Secondary | ICD-10-CM

## 2017-04-19 MED ORDER — AMOXICILLIN 875 MG PO TABS: 875.0000 mg | ORAL_TABLET | Freq: Two times a day (BID) | ORAL | 0 refills | Status: DC

## 2017-04-19 NOTE — Discharge Instructions (Signed)
Take medication as prescribed. Rest. Drink plenty of fluids.  ° °Follow up with your primary care physician this week as needed. Return to Urgent care for new or worsening concerns.  ° °

## 2017-04-19 NOTE — ED Provider Notes (Signed)
[facility] ____________________________________________  Time seen: Approximately 6:06 PM  I have reviewed the triage vital signs and the nursing notes.   HISTORY  Chief Complaint Otalgia (right)   HPI Kimberly Campbell is a 36 y.o. female presents for evaluation of approximate 5 days of right ear pain present. States pain has been consistently present and described as a dull aching pain. Denies pain radiation or denies fall, injury, trauma, discharge, bleeding or known trigger. States has had a history of similar with cerumen impaction and has been trying over-the-counter earwax drops without any improvement or resolution. States has been otherwise been taken some over-the-counter Tylenol and ibuprofen with slight improvement again with no resolution. States mild runny nose and nasal congestion intermittently consistent with her seasonal allergies. Denies fevers, sore throat, cough or sinus pain is consistently. Reports continues to eat and drink well. Reports continues remain active. Denies known sick contacts. Denies other aggravating or alleviating factors.States does not grind teeth. Denies injury or trauma.  Denies chest pain, shortness of breath, abdominal pain, dysuria, extremity pain, extremity swelling or rash. Denies recent sickness. Denies recent antibiotic use.   No LMP recorded. Patient has had an implant.Denies pregnancy.    Past Medical History:  Diagnosis Date  . Decreased fetal movement in pregnancy in third trimester, antepartum 02/10/2015  . Gestational diabetes   . Headache(784.0)    last one - 05/2011  . Missed abortion 12/2011   no surgery required  . No pertinent past medical history   . Uterine fibroids affecting pregnancy     Patient Active Problem List   Diagnosis Date Noted  . Pregnancy 02/22/2015  . Pregnancy w/ hx of uterine myomectomy 02/19/2015  . Previous cesarean section 02/19/2015  . GDM, class A2-insulin 02/19/2015  .  Thrombocytopenia affecting pregnancy, antepartum ([place]) 02/19/2015  . Postpartum care following cesarean delivery (6/16) 02/19/2015  . Decreased fetal movement in pregnancy in third trimester, antepartum 02/10/2015    Past Surgical History:  Procedure Laterality Date  . CESAREAN SECTION    . CESAREAN SECTION N/A 01/27/2014   Procedure: CESAREAN SECTION;  Surgeon: Claiborne Billings A. Pamala Hurry, MD;  Location: [facility];  Service: Obstetrics;  Laterality: N/A;  . CESAREAN SECTION N/A 02/19/2015   Procedure: Repeat CESAREAN SECTION;  Surgeon: Brien Few, MD;  Location: [facility];  Service: Obstetrics;  Laterality: N/A;  EDD: 03/16/15  . LEEP    . MYOMECTOMY    . ROBOT ASSISTED MYOMECTOMY  04/17/2012   Procedure: ROBOTIC ASSISTED MYOMECTOMY;  Surgeon: Princess Bruins, MD;  Location: [facility];  Service: Gynecology;  Laterality: N/A;  . WISDOM TOOTH EXTRACTION       No current facility-administered medications for this encounter.   Current Outpatient Prescriptions:  .  acetaminophen (TYLENOL) 500 MG tablet, Take 500 mg by mouth every 6 (six) hours as needed for mild pain, moderate pain or headache., Disp: , Rfl:  .  amoxicillin (AMOXIL) 875 MG tablet, Take 1 tablet (875 mg total) by mouth 2 (two) times daily., Disp: 20 tablet, Rfl: 0 .  etonogestrel (NEXPLANON) 68 MG IMPL implant, 1 each by Subdermal route once., Disp: , Rfl:  .  ibuprofen (ADVIL,MOTRIN) 600 MG tablet, Take 1 tablet (600 mg total) by mouth every 6 (six) hours as needed for mild pain., Disp: 30 tablet, Rfl: 0  Allergies Patient has no known allergies.  Family History  Problem Relation Age of Onset  . Hypertension Mother   . Diabetes Father   . Cancer Father  Social History Social History  Substance Use Topics  . Smoking status: Never Smoker  . Smokeless tobacco: Never Used  . Alcohol use No     Comment: rarely    Review of Systems Constitutional: No fever/chills Eyes: No visual changes. ENT: No sore throat.as above.    Cardiovascular: Denies chest pain. Respiratory: Denies shortness of breath. Gastrointestinal: No abdominal pain.  Musculoskeletal: Negative for atypical back pain. Skin: Negative for rash.  ____________________________________________   PHYSICAL EXAM:  VITAL SIGNS: ED Triage Vitals  Enc Vitals Group     BP 04/19/17 1750 127/88     Pulse Rate 04/19/17 1750 91     Resp 04/19/17 1750 16     Temp 04/19/17 1750 98.2 F (36.8 C)     Temp Source 04/19/17 1750 Oral     SpO2 04/19/17 1750 100 %     Weight 04/19/17 1749 230 lb (104.3 kg)     Height 04/19/17 1749 5\' 7"  (1.702 m)     Head Circumference --      Peak Flow --      Pain Score 04/19/17 1749 6     Pain Loc --      Pain Edu? --      Excl. in [place]? --     Constitutional: Alert and oriented. Well appearing and in no acute distress. Eyes: Conjunctivae are normal.  Head: Atraumatic. No sinus tenderness to palpation. No swelling. No erythema.  Ears: Left: no erythema, nontender, no drainage, normal TM. Right: nontender, normal canal, no drainage, moderate erythema and dullness. No surrounding tenderness, swelling or erythema bilaterally.   Nose:Mild nasal congestion.   Mouth/Throat: Mucous membranes are moist. No pharyngeal erythema. No tonsillar swelling or exudate.  Neck: No stridor.  No cervical spine tenderness to palpation. Hematological/Lymphatic/Immunilogical: No cervical lymphadenopathy. Cardiovascular: Normal rate, regular rhythm. Grossly normal heart sounds.  Good peripheral circulation. Respiratory: Normal respiratory effort.  No retractions. No wheezes, rales or rhonchi. Good air movement.  Musculoskeletal: Steady gait.  Neurologic:  Normal speech and language. No gait instability. Skin:  Skin appears warm, dry. Psychiatric: Mood and affect are normal. Speech and behavior are normal.  ___________________________________________   LABS (all labs ordered are listed, but only abnormal results are displayed)  Labs  Reviewed - No data to display   PROCEDURES Procedures    INITIAL IMPRESSION / ASSESSMENT AND PLAN / ED COURSE  Pertinent labs & imaging results that were available during my care of the patient were reviewed by me and considered in my medical decision making (see chart for details).  Well appearing patient. No acute distress. Right otitis media. Encouraged rest and fluids supportive care. Will treat patient with oral amoxicillin. Also encourage over-the-counter Zyrtec or Claritin for allergy control. Discussed follow-up and return parameters. Discussed indication, risks and benefits of medications with patient.  Discussed follow up with Primary care physician this week. Discussed follow up and return parameters including no resolution or any worsening concerns. Patient verbalized understanding and agreed to plan.   ____________________________________________   FINAL CLINICAL IMPRESSION(S) / ED DIAGNOSES  Final diagnoses:  Right otitis media, unspecified otitis media type     Discharge Medication List as of 04/19/2017  6:15 PM    START taking these medications   Details  amoxicillin (AMOXIL) 875 MG tablet Take 1 tablet (875 mg total) by mouth 2 (two) times daily., Starting Wed 04/19/2017, Normal        Note: This dictation was prepared with Dragon dictation along with smaller  Company secretary. Any transcriptional errors that result from this process are unintentional.         Marylene Land, NP 04/20/17 [phone]

## 2017-04-19 NOTE — ED Triage Notes (Signed)
Patient c/o right ear pain that started last Friday.

## 2017-09-24 DIAGNOSIS — G4733 Obstructive sleep apnea (adult) (pediatric): Secondary | ICD-10-CM | POA: Insufficient documentation

## 2017-12-08 ENCOUNTER — Encounter: Payer: Self-pay | Admitting: Emergency Medicine

## 2017-12-08 ENCOUNTER — Ambulatory Visit
Admission: EM | Admit: 2017-12-08 | Discharge: 2017-12-08 | Disposition: A | Discharge status: Home / Self Care | Payer: 59 | Attending: Family Medicine | Admitting: Family Medicine

## 2017-12-08 ENCOUNTER — Other Ambulatory Visit: Payer: Self-pay

## 2017-12-08 DIAGNOSIS — R300 Dysuria: Secondary | ICD-10-CM

## 2017-12-08 DIAGNOSIS — B379 Candidiasis, unspecified: Secondary | ICD-10-CM | POA: Diagnosis not present

## 2017-12-08 DIAGNOSIS — R3915 Urgency of urination: Secondary | ICD-10-CM | POA: Diagnosis not present

## 2017-12-08 DIAGNOSIS — R350 Frequency of micturition: Secondary | ICD-10-CM

## 2017-12-08 LAB — URINALYSIS, COMPLETE (UACMP) WITH MICROSCOPIC
Bilirubin Urine: NEGATIVE
GLUCOSE, UA: NEGATIVE mg/dL
Hgb urine dipstick: NEGATIVE
KETONES UR: NEGATIVE mg/dL
Nitrite: NEGATIVE
Protein, ur: NEGATIVE mg/dL
Specific Gravity, Urine: 1.025 (ref 1.005–1.030)
pH: 5.5 (ref 5.0–8.0)

## 2017-12-08 MED ORDER — NITROFURANTOIN MONOHYD MACRO 100 MG PO CAPS: 100.0000 mg | ORAL_CAPSULE | Freq: Two times a day (BID) | ORAL | 0 refills | Status: DC

## 2017-12-08 MED ORDER — FLUCONAZOLE 150 MG PO TABS: 150.0000 mg | ORAL_TABLET | Freq: Once | ORAL | 1 refills | Status: AC

## 2017-12-08 NOTE — ED Provider Notes (Signed)
[facility]  CSN: [id number] Arrival date & time: 12/08/17  [place]  History   Chief Complaint Chief Complaint  Patient presents with  . Dysuria   HPI  37 year old female presents with concerns for UTI.  Patient reports a 3.5-day history of dysuria, urgency, and frequency.  No fever.  No chills.  No nausea or vomiting.  She does report some back pain.  No abdominal pain. No flank pain.  No exacerbating or relieving factors. No other associated symptoms. No other complaints at this time.   Past Medical History:  Diagnosis Date  . Decreased fetal movement in pregnancy in third trimester, antepartum 02/10/2015  . Gestational diabetes   . Headache(784.0)    last one - 05/2011  . Missed abortion 12/2011   no surgery required  . No pertinent past medical history   . Uterine fibroids affecting pregnancy    Patient Active Problem List   Diagnosis Date Noted  . Pregnancy 02/22/2015  . Pregnancy w/ hx of uterine myomectomy 02/19/2015  . Previous cesarean section 02/19/2015  . GDM, class A2-insulin 02/19/2015  . Thrombocytopenia affecting pregnancy, antepartum ([place]) 02/19/2015  . Postpartum care following cesarean delivery (6/16) 02/19/2015  . Decreased fetal movement in pregnancy in third trimester, antepartum 02/10/2015   Past Surgical History:  Procedure Laterality Date  . CESAREAN SECTION    . CESAREAN SECTION N/A 01/27/2014   Procedure: CESAREAN SECTION;  Surgeon: Claiborne Billings A. Pamala Hurry, MD;  Location: [facility];  Service: Obstetrics;  Laterality: N/A;  . CESAREAN SECTION N/A 02/19/2015   Procedure: Repeat CESAREAN SECTION;  Surgeon: Brien Few, MD;  Location: [facility];  Service: Obstetrics;  Laterality: N/A;  EDD: 03/16/15  . LEEP    . MYOMECTOMY    . ROBOT ASSISTED MYOMECTOMY  04/17/2012   Procedure: ROBOTIC ASSISTED MYOMECTOMY;  Surgeon: Princess Bruins, MD;  Location: [facility];  Service: Gynecology;  Laterality: N/A;  . WISDOM TOOTH EXTRACTION     OB History    Gravida  6     Para  2   Term  1   Preterm  1   AB  1   Living  2     SAB  1   TAB  0   Ectopic  0   Multiple      Live Births  2          Home Medications    Prior to Admission medications   Medication Sig Start Date End Date Taking? Authorizing Provider  etonogestrel (NEXPLANON) 68 MG IMPL implant 1 each by Subdermal route once.   Yes [provider]  acetaminophen (TYLENOL) 500 MG tablet Take 500 mg by mouth every 6 (six) hours as needed for mild pain, moderate pain or headache.    [provider]  fluconazole (DIFLUCAN) 150 MG tablet Take 1 tablet (150 mg total) by mouth once for 1 dose. Repeat dose in 72 hours. 12/08/17 12/08/17  Coral Spikes, DO  ibuprofen (ADVIL,MOTRIN) 600 MG tablet Take 1 tablet (600 mg total) by mouth every 6 (six) hours as needed for mild pain. 02/22/15   Artelia Laroche, CNM  nitrofurantoin, macrocrystal-monohydrate, (MACROBID) 100 MG capsule Take 1 capsule (100 mg total) by mouth 2 (two) times daily. 12/08/17   Coral Spikes, DO  insulin glargine (LANTUS) 100 UNIT/ML injection Inject 0.15 mLs (15 Units total) into the skin at bedtime. 02/22/15 07/06/15  Artelia Laroche, CNM  iron polysaccharides (NIFEREX) 150 MG capsule Take 1 capsule (150 mg total) by  mouth daily. 02/22/15 07/06/15  Artelia Laroche, CNM    Family History Family History  Problem Relation Age of Onset  . Hypertension Mother   . Diabetes Father   . Cancer Father     Social History Social History   Tobacco Use  . Smoking status: Never Smoker  . Smokeless tobacco: Never Used  Substance Use Topics  . Alcohol use: No    Comment: rarely  . Drug use: No     Allergies   Patient has no known allergies.   Review of Systems Review of Systems  Constitutional: Negative for chills and fever.  Gastrointestinal: Negative.   Genitourinary: Positive for dysuria, frequency and urgency. Negative for flank pain.   Physical Exam Triage Vital Signs ED Triage Vitals  Enc Vitals  Group     BP 12/08/17 1849 (!) 135/93     Pulse Rate 12/08/17 1849 81     Resp 12/08/17 1849 16     Temp 12/08/17 1849 98.5 F (36.9 C)     Temp Source 12/08/17 1849 Oral     SpO2 12/08/17 1849 100 %     Weight 12/08/17 1847 240 lb (108.9 kg)     Height 12/08/17 1847 5\' 7"  (1.702 m)     Head Circumference --      Peak Flow --      Pain Score 12/08/17 1847 2     Pain Loc --      Pain Edu? --      Excl. in [place]? --    Updated Vital Signs BP (!) 135/93 (BP Location: Left Arm)   Pulse 81   Temp 98.5 F (36.9 C) (Oral)   Resp 16   Ht 5\' 7"  (1.702 m)   Wt 240 lb (108.9 kg)   SpO2 100%   BMI 37.59 kg/m   Physical Exam  Constitutional: She appears well-developed. No distress.  HENT:  Head: Normocephalic and atraumatic.  Cardiovascular: Normal rate and regular rhythm.  Pulmonary/Chest: Effort normal and breath sounds normal. She has no wheezes. She has no rales.  Abdominal: Soft. She exhibits no distension. There is no tenderness.  Neurological: She is alert.  Psychiatric: She has a normal mood and affect. Her behavior is normal.  Nursing note and vitals reviewed.  UC Treatments / Results  Labs (all labs ordered are listed, but only abnormal results are displayed) Labs Reviewed  URINALYSIS, COMPLETE (UACMP) WITH MICROSCOPIC - Abnormal; Notable for the following components:      Result Value   APPearance HAZY (*)    Leukocytes, UA TRACE (*)    Squamous Epithelial / LPF 6-30 (*)    Bacteria, UA MANY (*)    All other components within normal limits  URINE CULTURE    EKG None Radiology No results found.  Procedures Procedures (including critical care time)  Medications Ordered in UC Medications - No data to display   Initial Impression / Assessment and Plan / UC Course  I have reviewed the triage vital signs and the nursing notes.  Pertinent labs & imaging results that were available during my care of the patient were reviewed by me and considered in my medical  decision making (see chart for details).     37 year old female presents with symptoms of UTI.  Patient with leukocytes on dipstick and pyuria on microscopy.  She did have budding yeast as well.  I am treating her empirically with Macrobid while awaiting culture.  Fluconazole as well.  Final Clinical Impressions(s) /  UC Diagnoses   Final diagnoses:  Dysuria  Yeast infection    ED Discharge Orders        Ordered    nitrofurantoin, macrocrystal-monohydrate, (MACROBID) 100 MG capsule  2 times daily     12/08/17 1915    fluconazole (DIFLUCAN) 150 MG tablet   Once     12/08/17 1915     Controlled Substance Prescriptions [place] Controlled Substance Registry consulted? Not Applicable   Coral Spikes, DO 12/08/17 1918

## 2017-12-08 NOTE — ED Triage Notes (Signed)
Patient c/o burning when urinating for the past 3 days.  

## 2017-12-08 NOTE — Discharge Instructions (Signed)
Medications as prescribed. ° °Take care ° °Dr. Meshulem Onorato  °

## 2017-12-11 ENCOUNTER — Telehealth (HOSPITAL_COMMUNITY): Payer: Self-pay

## 2017-12-11 LAB — URINE CULTURE: Culture: 80000 — AB

## 2017-12-11 MED ORDER — CEPHALEXIN 500 MG PO CAPS: 500.0000 mg | ORAL_CAPSULE | Freq: Two times a day (BID) | ORAL | 0 refills | Status: AC

## 2017-12-11 NOTE — Telephone Encounter (Signed)
Pt contacted regarding results from recent visit. Urine culture was positive for Klebsiella Pneumoniae bacteria resistant to antibiotic given at urgent care visit. Prescription for  Keflex 500 mg BID x 5 days per Dr. Valere Dross per sent to pharmacy of choice. Pt educated to follow up if symptoms are not improving.

## 2018-03-31 ENCOUNTER — Encounter: Payer: Self-pay | Admitting: Emergency Medicine

## 2018-03-31 ENCOUNTER — Ambulatory Visit
Admission: EM | Admit: 2018-03-31 | Discharge: 2018-03-31 | Disposition: A | Discharge status: Home / Self Care | Payer: Managed Care, Other (non HMO) | Attending: Family Medicine | Admitting: Family Medicine

## 2018-03-31 ENCOUNTER — Other Ambulatory Visit: Payer: Self-pay

## 2018-03-31 DIAGNOSIS — J069 Acute upper respiratory infection, unspecified: Secondary | ICD-10-CM

## 2018-03-31 MED ORDER — BENZONATATE 100 MG PO CAPS: 100.0000 mg | ORAL_CAPSULE | Freq: Three times a day (TID) | ORAL | 0 refills | Status: DC | PRN

## 2018-03-31 MED ORDER — HYDROCOD POLST-CPM POLST ER 10-8 MG/5ML PO SUER: 5.0000 mL | Freq: Every evening | ORAL | 0 refills | Status: DC | PRN

## 2018-03-31 NOTE — ED Triage Notes (Signed)
Patient c/o nasal pressure and congestion that started on Thursday. Denies fever. Has taken Benadrul allergy and Sinus, Zyrtec, Sudafed and Advil with no relief.

## 2018-03-31 NOTE — ED Provider Notes (Signed)
[facility] ____________________________________________  Time seen: Approximately 9:18 AM  I have reviewed the triage vital signs and the nursing notes.   HISTORY  Chief Complaint Nasal Congestion   HPI Kimberly Campbell is a 37 y.o. female presenting for evaluation of cough and congestion symptoms present for last 4 days, since Tuesday.  States Tuesday initially started with scratchy throat, and reports cough and congestion symptoms started on Wednesday.  States her biggest complaint is nasal congestion sinus pressure sensation.  States has been using multiple over-the-counter cough and decongestant agents without resolution.  States throat discomfort has since resolved. Denies known fevers.  Denies known sick contacts.  Has continue to remain active.  States nasal congestion interrupting her use of her CPAP at night.  Denies chest pain, shortness of breath,extremity pain, extremity swelling or rash. Denies recent sickness. Denies recent antibiotic use.   No LMP recorded. Patient has had an implant.Denies pregnancy. [place] [place] [facility]: PCP   Past Medical History:  Diagnosis Date  . Decreased fetal movement in pregnancy in third trimester, antepartum 02/10/2015  . Gestational diabetes   . Headache(784.0)    last one - 05/2011  . Missed abortion 12/2011   no surgery required  . No pertinent past medical history   . Uterine fibroids affecting pregnancy     Patient Active Problem List   Diagnosis Date Noted  . Pregnancy 02/22/2015  . Pregnancy w/ hx of uterine myomectomy 02/19/2015  . Previous cesarean section 02/19/2015  . GDM, class A2-insulin 02/19/2015  . Thrombocytopenia affecting pregnancy, antepartum ([place]) 02/19/2015  . Postpartum care following cesarean delivery (6/16) 02/19/2015  . Decreased fetal movement in pregnancy in third trimester, antepartum 02/10/2015    Past Surgical History:  Procedure Laterality Date  . CESAREAN SECTION    .  CESAREAN SECTION N/A 01/27/2014   Procedure: CESAREAN SECTION;  Surgeon: Claiborne Billings A. Pamala Hurry, MD;  Location: [facility];  Service: Obstetrics;  Laterality: N/A;  . CESAREAN SECTION N/A 02/19/2015   Procedure: Repeat CESAREAN SECTION;  Surgeon: Brien Few, MD;  Location: [facility];  Service: Obstetrics;  Laterality: N/A;  EDD: 03/16/15  . LEEP    . MYOMECTOMY    . ROBOT ASSISTED MYOMECTOMY  04/17/2012   Procedure: ROBOTIC ASSISTED MYOMECTOMY;  Surgeon: Princess Bruins, MD;  Location: [facility];  Service: Gynecology;  Laterality: N/A;  . WISDOM TOOTH EXTRACTION       No current facility-administered medications for this encounter.   Current Outpatient Medications:  .  etonogestrel (NEXPLANON) 68 MG IMPL implant, 1 each by Subdermal route once., Disp: , Rfl:  .  ibuprofen (ADVIL,MOTRIN) 600 MG tablet, Take 1 tablet (600 mg total) by mouth every 6 (six) hours as needed for mild pain., Disp: 30 tablet, Rfl: 0 .  benzonatate (TESSALON PERLES) 100 MG capsule, Take 1 capsule (100 mg total) by mouth 3 (three) times daily as needed for cough., Disp: 15 capsule, Rfl: 0 .  chlorpheniramine-HYDROcodone (TUSSIONEX PENNKINETIC ER) 10-8 MG/5ML SUER, Take 5 mLs by mouth at bedtime as needed for cough. do not drive or operate machinery while taking as can cause drowsiness., Disp: 50 mL, Rfl: 0  Allergies Patient has no known allergies.  Family History  Problem Relation Age of Onset  . Hypertension Mother   . Diabetes Father   . Cancer Father     Social History Social History   Tobacco Use  . Smoking status: Never Smoker  . Smokeless tobacco: Never Used  Substance Use Topics  . Alcohol  use: No    Comment: rarely  . Drug use: No    Review of Systems Constitutional: No fever ENT: As above.  Cardiovascular: Denies chest pain. Respiratory: Denies shortness of breath. Gastrointestinal: No abdominal pain.  Musculoskeletal: Negative for back pain. Skin: Negative for  rash.   ____________________________________________   PHYSICAL EXAM:  VITAL SIGNS: ED Triage Vitals  Enc Vitals Group     BP 03/31/18 0821 (!) 133/94     Pulse Rate 03/31/18 0821 93     Resp 03/31/18 0821 18     Temp 03/31/18 0821 98.6 F (37 C)     Temp Source 03/31/18 0821 Oral     SpO2 03/31/18 0821 100 %     Weight 03/31/18 0819 240 lb (108.9 kg)     Height 03/31/18 0819 5\' 7"  (1.702 m)     Head Circumference --      Peak Flow --      Pain Score 03/31/18 0819 8     Pain Loc --      Pain Edu? --      Excl. in [place]? --    Constitutional: Alert and oriented. Well appearing and in no acute distress. Eyes: Conjunctivae are normal.  Head: Atraumatic.Mild to moderate tenderness to palpation bilateral maxillary sinuses. No frontal sinus tenderness. No swelling. No erythema.   Ears: no erythema, normal TMs bilaterally.   Nose: nasal congestion with bilateral nasal turbinate erythema and edema.   Mouth/Throat: Mucous membranes are moist.  Oropharynx non-erythematous.No tonsillar swelling or exudate.  Neck: No stridor.  No cervical spine tenderness to palpation. Hematological/Lymphatic/Immunilogical: No cervical lymphadenopathy. Cardiovascular: Normal rate, regular rhythm. Grossly normal heart sounds.  Good peripheral circulation. Respiratory: Normal respiratory effort.  No retractions.No wheezes, rales or rhonchi. Good air movement. Occasional dry cough noted in room.  Musculoskeletal: No lower extremity tenderness nor edema. Steady gait.  Neurologic:  Normal speech and language.No gait instability. Skin:  Skin is warm, dry and intact. No rash noted. Psychiatric: Mood and affect are normal. Speech and behavior are normal.  ___________________________________________   LABS (all labs ordered are listed, but only abnormal results are displayed)  Labs Reviewed - No data to display ____________________________________________   PROCEDURES Procedures   INITIAL IMPRESSION /  ASSESSMENT AND PLAN / ED COURSE  Pertinent labs & imaging results that were available during my care of the patient were reviewed by me and considered in my medical decision making (see chart for details).  Well-appearing patient.  No acute distress.  Cough and congestion symptoms for the last 4 days.  Lungs clear throughout.  Suspect viral upper respiratory infection.  Encourage supportive care.  Will treat with PRN Tussionex, PRN Tessalon Perles, over-the-counter Afrin and Sudafed.  Encourage rest, fluids, supportive care.Discussed indication, risks and benefits of medications with patient.  Discussed follow up with Primary care physician this week. Discussed follow up and return parameters including no resolution or any worsening concerns. Patient verbalized understanding and agreed to plan.   ____________________________________________   FINAL CLINICAL IMPRESSION(S) / ED DIAGNOSES  Final diagnoses:  Upper respiratory tract infection, unspecified type     ED Discharge Orders        Ordered    chlorpheniramine-HYDROcodone (TUSSIONEX PENNKINETIC ER) 10-8 MG/5ML SUER  At bedtime PRN     03/31/18 0849    benzonatate (TESSALON PERLES) 100 MG capsule  3 times daily PRN     03/31/18 0849       Note: This dictation was prepared with Viviann Spare  dictation along with smaller phrase technology. Any transcriptional errors that result from this process are unintentional.         Marylene Land, NP 03/31/18 [phone]

## 2018-03-31 NOTE — Discharge Instructions (Addendum)
Take medication as prescribed. Rest. Drink plenty of fluids.  Use over-the-counter Sudafed and Afrin as discussed.  Follow up with your primary care physician this week as needed. Return to Urgent care for new or worsening concerns.

## 2018-07-12 ENCOUNTER — Ambulatory Visit
Admission: EM | Admit: 2018-07-12 | Discharge: 2018-07-12 | Disposition: A | Discharge status: Home / Self Care | Payer: Managed Care, Other (non HMO) | Attending: Family Medicine | Admitting: Family Medicine

## 2018-07-12 ENCOUNTER — Other Ambulatory Visit: Payer: Self-pay

## 2018-07-12 DIAGNOSIS — X58XXXA Exposure to other specified factors, initial encounter: Secondary | ICD-10-CM | POA: Diagnosis not present

## 2018-07-12 DIAGNOSIS — S0502XA Injury of conjunctiva and corneal abrasion without foreign body, left eye, initial encounter: Secondary | ICD-10-CM

## 2018-07-12 MED ORDER — KETOROLAC TROMETHAMINE 0.5 % OP SOLN: 1.0000 [drp] | Freq: Four times a day (QID) | OPHTHALMIC | 0 refills | Status: DC

## 2018-07-12 MED ORDER — CIPROFLOXACIN HCL 0.3 % OP OINT: TOPICAL_OINTMENT | OPHTHALMIC | 0 refills | Status: DC

## 2018-07-12 NOTE — ED Provider Notes (Signed)
[facility]    CSN: [id number] Arrival date & time: 07/12/18  0807     History   Chief Complaint Chief Complaint  Patient presents with  . Eye Pain    left    HPI Kimberly Campbell is a 37 y.o. female.   HPI  37 year old female presents with left eye pain that awoke her from sleeping at about 1:00 this morning.  The pain is  sharp.  She states that she tried to flush her eyes.  She wears contacts and tends to leave them in for prolonged periods. In fact  she had her contacts in last night when she awoke.  Eye has  been very watery.  She states when she sits upright does not seem to bother her as much as when she is recumbent.  She felt initially she had something in her eye.  She does have photophobia today not to a great degree as she is able to tolerate ambient light but with the bright light of her examination does cause her to have discomfort.  Had a similar problem in the past with a corneal abrasion from prolonged contact use.  Visual acuity is 20/70 bilaterally uncorrected.             Past Medical History:  Diagnosis Date  . Decreased fetal movement in pregnancy in third trimester, antepartum 02/10/2015  . Gestational diabetes   . Headache(784.0)    last one - 05/2011  . Missed abortion 12/2011   no surgery required  . No pertinent past medical history   . Uterine fibroids affecting pregnancy     Patient Active Problem List   Diagnosis Date Noted  . Pregnancy 02/22/2015  . Pregnancy w/ hx of uterine myomectomy 02/19/2015  . Previous cesarean section 02/19/2015  . GDM, class A2-insulin 02/19/2015  . Thrombocytopenia affecting pregnancy, antepartum ([place]) 02/19/2015  . Postpartum care following cesarean delivery (6/16) 02/19/2015  . Decreased fetal movement in pregnancy in third trimester, antepartum 02/10/2015    Past Surgical History:  Procedure Laterality Date  . CESAREAN SECTION    . CESAREAN SECTION N/A 01/27/2014   Procedure: CESAREAN  SECTION;  Surgeon: Claiborne Billings A. Pamala Hurry, MD;  Location: [facility];  Service: Obstetrics;  Laterality: N/A;  . CESAREAN SECTION N/A 02/19/2015   Procedure: Repeat CESAREAN SECTION;  Surgeon: Brien Few, MD;  Location: [facility];  Service: Obstetrics;  Laterality: N/A;  EDD: 03/16/15  . LEEP    . MYOMECTOMY    . ROBOT ASSISTED MYOMECTOMY  04/17/2012   Procedure: ROBOTIC ASSISTED MYOMECTOMY;  Surgeon: Princess Bruins, MD;  Location: [facility];  Service: Gynecology;  Laterality: N/A;  . WISDOM TOOTH EXTRACTION      OB History    Gravida  6   Para  2   Term  1   Preterm  1   AB  1   Living  2     SAB  1   TAB  0   Ectopic  0   Multiple      Live Births  2            Home Medications    Prior to Admission medications   Medication Sig Start Date End Date Taking? Authorizing Provider  etonogestrel (NEXPLANON) 68 MG IMPL implant 1 each by Subdermal route once.   Yes [provider]  ciprofloxacin (CILOXAN) 0.3 % ophthalmic ointment 1 to 2 drops in left eye every 2 hours while awake for 2 days then every 4  hours for 5 days 07/12/18   Lorin Picket, PA-C  ketorolac (ACULAR) 0.5 % ophthalmic solution Place 1 drop into the left eye every 6 (six) hours. 07/12/18   Lorin Picket, PA-C    Family History Family History  Problem Relation Age of Onset  . Hypertension Mother   . Diabetes Father   . Cancer Father     Social History Social History   Tobacco Use  . Smoking status: Never Smoker  . Smokeless tobacco: Never Used  Substance Use Topics  . Alcohol use: Yes    Comment: rarely  . Drug use: No     Allergies   Patient has no known allergies.   Review of Systems Review of Systems  Constitutional: Positive for activity change. Negative for appetite change, chills, fatigue and fever.  Eyes: Positive for photophobia, pain, discharge and redness.  All other systems reviewed and are negative.    Physical Exam Triage Vital Signs ED Triage Vitals    Enc Vitals Group     BP 07/12/18 0821 (!) 138/93     Pulse Rate 07/12/18 0821 80     Resp 07/12/18 0821 16     Temp 07/12/18 0821 98.1 F (36.7 C)     Temp Source 07/12/18 0821 Oral     SpO2 07/12/18 0821 100 %     Weight 07/12/18 0819 240 lb (108.9 kg)     Height 07/12/18 0819 5\' 8"  (1.727 m)     Head Circumference --      Peak Flow --      Pain Score 07/12/18 0819 6     Pain Loc --      Pain Edu? --      Excl. in [place]? --    No data found.  Updated Vital Signs BP (!) 138/93 (BP Location: Left Arm)   Pulse 80   Temp 98.1 F (36.7 C) (Oral)   Resp 16   Ht 5\' 8"  (1.727 m)   Wt 240 lb (108.9 kg)   SpO2 100%   BMI 36.49 kg/m   Visual Acuity Right Eye Distance: 20/70(uncorrected, left contacts at home. ) Left Eye Distance: 20/70(uncorrected, left contacts at home) Bilateral Distance:    Right Eye Near:   Left Eye Near:    Bilateral Near:     Physical Exam  Constitutional: She is oriented to person, place, and time. She appears well-developed and well-nourished. No distress.  HENT:  Head: Normocephalic.  Eyes: Pupils are equal, round, and reactive to light. EOM are normal. Right eye exhibits no discharge. Left eye exhibits discharge.  Visual acuity is 20/70 bilateral uncorrected.  PERRLA EOMs are intact.  Left eyelid was everted with no findings of foreign body.  Left eye was anesthetized with tetracaine.  Fluorescein stain was then instilled.  Woods lamp magnification/ illumination revealed a small corneal abrasion  at the 7 o'clock position.  Neck: Normal range of motion. Neck supple.  Musculoskeletal: Normal range of motion.  Neurological: She is alert and oriented to person, place, and time.  Skin: Skin is warm and dry. She is not diaphoretic.  Psychiatric: She has a normal mood and affect. Her behavior is normal. Judgment and thought content normal.  Nursing note and vitals reviewed.    UC Treatments / Results  Labs (all labs ordered are listed, but only  abnormal results are displayed) Labs Reviewed - No data to display  EKG None  Radiology No results found.  Procedures Procedures (including critical care  time)  Medications Ordered in UC Medications - No data to display  Initial Impression / Assessment and Plan / UC Course  I have reviewed the triage vital signs and the nursing notes.  Pertinent labs & imaging results that were available during my care of the patient were reviewed by me and considered in my medical decision making (see chart for details).   We will place the patient on pro-ophthalmic as well as ketorolac ophthalmic for pain.  Do not use contacts until her eyes have completely resolved.  Is not improved in 24 to 48 hours she should follow-up with her ophthalmologist.   Final Clinical Impressions(s) / UC Diagnoses   Final diagnoses:  Abrasion of left cornea, initial encounter     Discharge Instructions     Not improved in 24 to 48 hours go to your ophthalmologist for follow-up    ED Prescriptions    Medication Sig Dispense Auth. Provider   ciprofloxacin (CILOXAN) 0.3 % ophthalmic ointment 1 to 2 drops in left eye every 2 hours while awake for 2 days then every 4 hours for 5 days 3.5 g Crecencio Mc P, PA-C   ketorolac (ACULAR) 0.5 % ophthalmic solution Place 1 drop into the left eye every 6 (six) hours. 5 mL Lorin Picket, PA-C     Controlled Substance Prescriptions [place] Controlled Substance Registry consulted? Not Applicable   Lorin Picket, PA-C 07/12/18 1708

## 2018-07-12 NOTE — ED Triage Notes (Signed)
Patient complains of left eye pain that started this morning. Patient states that the pain has been sharp and she has tried to flush her eye. Patient states that eye has been very watery.

## 2018-07-12 NOTE — Discharge Instructions (Signed)
Not improved in 24 to 48 hours go to your ophthalmologist for follow-up

## 2018-11-25 ENCOUNTER — Other Ambulatory Visit: Payer: Self-pay

## 2018-11-25 ENCOUNTER — Ambulatory Visit
Admission: EM | Admit: 2018-11-25 | Discharge: 2018-11-25 | Disposition: A | Discharge status: Home / Self Care | Payer: Managed Care, Other (non HMO) | Attending: Family Medicine | Admitting: Family Medicine

## 2018-11-25 DIAGNOSIS — A059 Bacterial foodborne intoxication, unspecified: Secondary | ICD-10-CM

## 2018-11-25 NOTE — Discharge Instructions (Addendum)
Clear liquids then advance diet slowly as tolerated Imodium AD as needed

## 2018-11-25 NOTE — ED Provider Notes (Signed)
[facility]    CSN: [id number] Arrival date & time: 11/25/18  1130     History   Chief Complaint Chief Complaint  Patient presents with   Diarrhea   Tachycardia    HPI Kimberly Campbell is a 38 y.o. female.   38 yo female with a c/o diarrhea and upset stomach that started Friday after eating out. States she felt nauseous and started having diarrhea. Had about 5 episodes Friday and 3 episodes yesterday. She took some Imodium yesterday and feels slightly better today. Has not had any diarrhea today. Denies any vomiting.   The history is provided by the patient.  Diarrhea    Past Medical History:  Diagnosis Date   Decreased fetal movement in pregnancy in third trimester, antepartum 02/10/2015   Gestational diabetes    Headache(784.0)    last one - 05/2011   Missed abortion 12/2011   no surgery required   No pertinent past medical history    Uterine fibroids affecting pregnancy     Patient Active Problem List   Diagnosis Date Noted   Pregnancy 02/22/2015   Pregnancy w/ hx of uterine myomectomy 02/19/2015   Previous cesarean section 02/19/2015   GDM, class A2-insulin 02/19/2015   Thrombocytopenia affecting pregnancy, antepartum ([place]) 02/19/2015   Postpartum care following cesarean delivery (6/16) 02/19/2015   Decreased fetal movement in pregnancy in third trimester, antepartum 02/10/2015    Past Surgical History:  Procedure Laterality Date   CESAREAN SECTION     CESAREAN SECTION N/A 01/27/2014   Procedure: CESAREAN SECTION;  Surgeon: Claiborne Billings A. Pamala Hurry, MD;  Location: [facility];  Service: Obstetrics;  Laterality: N/A;   CESAREAN SECTION N/A 02/19/2015   Procedure: Repeat CESAREAN SECTION;  Surgeon: Brien Few, MD;  Location: [facility];  Service: Obstetrics;  Laterality: N/A;  EDD: 03/16/15   LEEP     MYOMECTOMY     ROBOT ASSISTED MYOMECTOMY  04/17/2012   Procedure: ROBOTIC ASSISTED MYOMECTOMY;  Surgeon: Princess Bruins, MD;  Location:  [facility];  Service: Gynecology;  Laterality: N/A;   WISDOM TOOTH EXTRACTION      OB History    Gravida  6   Para  2   Term  1   Preterm  1   AB  1   Living  2     SAB  1   TAB  0   Ectopic  0   Multiple      Live Births  2            Home Medications    Prior to Admission medications   Medication Sig Start Date End Date Taking? Authorizing Provider  ciprofloxacin (CILOXAN) 0.3 % ophthalmic ointment 1 to 2 drops in left eye every 2 hours while awake for 2 days then every 4 hours for 5 days 07/12/18   Lorin Picket, PA-C  etonogestrel (NEXPLANON) 68 MG IMPL implant 1 each by Subdermal route once.    [provider]  ketorolac (ACULAR) 0.5 % ophthalmic solution Place 1 drop into the left eye every 6 (six) hours. 07/12/18   Lorin Picket, PA-C    Family History Family History  Problem Relation Age of Onset   Hypertension Mother    Diabetes Father    Cancer Father     Social History Social History   Tobacco Use   Smoking status: Never Smoker   Smokeless tobacco: Never Used  Substance Use Topics   Alcohol use: Yes    Comment: rarely  Drug use: No     Allergies   Sulfa antibiotics   Review of Systems Review of Systems  Gastrointestinal: Positive for diarrhea.     Physical Exam Triage Vital Signs ED Triage Vitals  Enc Vitals Group     BP 11/25/18 1140 (!) 148/89     Pulse Rate 11/25/18 1140 85     Resp 11/25/18 1140 16     Temp 11/25/18 1140 98.1 F (36.7 C)     Temp Source 11/25/18 1140 Oral     SpO2 11/25/18 1140 100 %     Weight 11/25/18 1142 245 lb (111.1 kg)     Height 11/25/18 1142 5\' 7"  (1.702 m)     Head Circumference --      Peak Flow --      Pain Score 11/25/18 1142 0     Pain Loc --      Pain Edu? --      Excl. in [place]? --    No data found.  Updated Vital Signs BP (!) 148/89 (BP Location: Left Arm)    Pulse 85    Temp 98.1 F (36.7 C) (Oral)    Resp 16    Ht 5\' 7"  (1.702 m)    Wt 111.1 kg    SpO2  100%    BMI 38.37 kg/m   Visual Acuity Right Eye Distance:   Left Eye Distance:   Bilateral Distance:    Right Eye Near:   Left Eye Near:    Bilateral Near:     Physical Exam Vitals signs and nursing note reviewed.  Constitutional:      General: She is not in acute distress.    Appearance: She is not toxic-appearing or diaphoretic.  Abdominal:     General: Bowel sounds are normal. There is no distension.     Palpations: Abdomen is soft. There is no mass.     Tenderness: There is no abdominal tenderness. There is no right CVA tenderness, left CVA tenderness, guarding or rebound.     Hernia: No hernia is present.  Neurological:     Mental Status: She is alert.      UC Treatments / Results  Labs (all labs ordered are listed, but only abnormal results are displayed) Labs Reviewed - No data to display  EKG None  Radiology No results found.  Procedures Procedures (including critical care time)  Medications Ordered in UC Medications - No data to display  Initial Impression / Assessment and Plan / UC Course  I have reviewed the triage vital signs and the nursing notes.  Pertinent labs & imaging results that were available during my care of the patient were reviewed by me and considered in my medical decision making (see chart for details).      Final Clinical Impressions(s) / UC Diagnoses   Final diagnoses:  Food poisoning     Discharge Instructions     Clear liquids then advance diet slowly as tolerated Imodium AD as needed     ED Prescriptions    None     1. diagnosis reviewed with patient  2. Recommend supportive treatment as above 3. Follow-up prn if symptoms worsen or don't improve   Controlled Substance Prescriptions [place] Controlled Substance Registry consulted? Not Applicable   Norval Gable, MD 11/25/18 [phone]

## 2018-11-25 NOTE — ED Triage Notes (Signed)
Friday started with diarrhea and upset stomach which is somewhat better today. Starting Saturday morning sometimes feels like her heart is racing. Not currently having sx

## 2020-01-07 ENCOUNTER — Other Ambulatory Visit: Payer: Self-pay | Admitting: Certified Nurse Midwife

## 2020-01-07 DIAGNOSIS — Z1231 Encounter for screening mammogram for malignant neoplasm of breast: Secondary | ICD-10-CM

## 2020-01-21 ENCOUNTER — Ambulatory Visit
Admission: RE | Admit: 2020-01-21 | Discharge: 2020-01-21 | Disposition: A | Discharge status: Home / Self Care | Payer: Managed Care, Other (non HMO) | Source: Ambulatory Visit | Attending: Certified Nurse Midwife | Admitting: Certified Nurse Midwife

## 2020-01-21 ENCOUNTER — Other Ambulatory Visit: Payer: Self-pay

## 2020-01-21 ENCOUNTER — Ambulatory Visit: Payer: Managed Care, Other (non HMO)

## 2020-01-21 DIAGNOSIS — Z1231 Encounter for screening mammogram for malignant neoplasm of breast: Secondary | ICD-10-CM | POA: Insufficient documentation

## 2020-01-24 ENCOUNTER — Other Ambulatory Visit: Payer: Self-pay | Admitting: Podiatry

## 2020-01-24 ENCOUNTER — Ambulatory Visit: Payer: Managed Care, Other (non HMO) | Admitting: Podiatry

## 2020-01-24 ENCOUNTER — Other Ambulatory Visit: Payer: Self-pay

## 2020-01-24 ENCOUNTER — Ambulatory Visit (INDEPENDENT_AMBULATORY_CARE_PROVIDER_SITE_OTHER): Payer: Managed Care, Other (non HMO)

## 2020-01-24 DIAGNOSIS — M5416 Radiculopathy, lumbar region: Secondary | ICD-10-CM

## 2020-01-24 DIAGNOSIS — L600 Ingrowing nail: Secondary | ICD-10-CM

## 2020-01-24 DIAGNOSIS — M722 Plantar fascial fibromatosis: Secondary | ICD-10-CM

## 2020-01-24 DIAGNOSIS — B351 Tinea unguium: Secondary | ICD-10-CM

## 2020-01-24 DIAGNOSIS — M79676 Pain in unspecified toe(s): Secondary | ICD-10-CM | POA: Diagnosis not present

## 2020-01-24 MED ORDER — METHYLPREDNISOLONE 4 MG PO TBPK
ORAL_TABLET | ORAL | 0 refills | Status: DC
Start: 2020-01-24 — End: 2020-07-26

## 2020-01-24 MED ORDER — GENTAMICIN SULFATE 0.1 % EX CREA
1.0000 | TOPICAL_CREAM | Freq: Two times a day (BID) | CUTANEOUS | 1 refills | Status: DC
Start: 2020-01-24 — End: 2020-07-26

## 2020-01-24 MED ORDER — GABAPENTIN 100 MG PO CAPS
100.0000 mg | ORAL_CAPSULE | Freq: Every day | ORAL | 0 refills | Status: DC
Start: 2020-01-24 — End: 2020-07-26

## 2020-01-26 NOTE — Progress Notes (Signed)
Subjective: Patient presents today for evaluation of sharp, throbbing pain to the medial borders of the bilateral great toes as well as the lateral border of the left great toe that began about three weeks ago. Patient is concerned for possible ingrown nail. She has been trimming the nails herself for treatment. She reports h/o ingrown nails that have been cut out by another podiatrist.  She also reports tightness in the feet secondary to neuropathy that has been ongoing for the past several months. Walking makes the pain worse for both complaints. Resting the feet help alleviate her symptoms. Patient presents today for further treatment and evaluation.  Past Medical History:  Diagnosis Date  . Decreased fetal movement in pregnancy in third trimester, antepartum 02/10/2015  . Gestational diabetes   . Headache(784.0)    last one - 05/2011  . Missed abortion 12/2011   no surgery required  . No pertinent past medical history   . Uterine fibroids affecting pregnancy     Objective:  General: Well developed, nourished, in no acute distress, alert and oriented x3   Dermatology: Skin is warm, dry and supple bilateral. Medial border of the right hallux and medial and lateral broders of the left hallux appears to be erythematous with evidence of an ingrowing nail. Pain on palpation noted to the border of the nail fold. Nails are tender, long, thickened and dystrophic with subungual debris, consistent with onychomycosis, 1-5 bilateral. No signs of infection noted.  Vascular: Dorsalis Pedis artery and Posterior Tibial artery pedal pulses palpable. No lower extremity edema noted.   Neruologic: Diminished via light touch bilateral.  Musculoskeletal: Muscular strength within normal limits in all groups bilateral. Normal range of motion noted to all pedal and ankle joints.   Assesement: #1 Paronychia with ingrowing nail medial border right hallux; medial and lateral borders left hallux - recurrent  #2  Pain in toe #3 Incurvated nail #4 Onychodystrophic nails 1-5 bilateral with hyperkeratosis of nails.  #5 Onychomycosis of nail due to dermatophyte bilateral #6 Lumbar radiculopathy RLE   Plan of Care:  1. Patient evaluated.  2. Discussed treatment alternatives and plan of care. Explained nail avulsion procedure and post procedure course to patient. 3. Patient opted for permanent partial nail avulsion of the medial border right hallux; medial and lateral borders left hallux.  4. Prior to procedure, local anesthesia infiltration utilized using 3 ml of a 50:50 mixture of 2% plain lidocaine and 0.5% plain marcaine in a normal hallux block fashion and a betadine prep performed.  5. Partial permanent nail avulsion with chemical matrixectomy performed using [id number] applications of phenol followed by alcohol flush.  6. Light dressing applied. 7. Mechanical debridement of nails 1-5 bilaterally performed using a nail nipper. Filed with dremel without incident.  8. Prescription for Medrol Dose Pak provided to patient. 9. Prescription for Gabapentin 100 mg QHS provided to patient.  10. Prescription for Gentamicin cream provided to patient to use daily with a bandage.  11. Return to clinic as needed.  Edrick Kins, DPM [facility]  Dr. Edrick Kins, [address]                                        [place], [place] [address]                Office [phone]  Fax [phone]  [phone]     

## 2020-02-11 ENCOUNTER — Ambulatory Visit: Payer: Managed Care, Other (non HMO) | Admitting: Podiatry

## 2020-03-31 ENCOUNTER — Encounter: Payer: Self-pay | Admitting: Podiatry

## 2020-03-31 ENCOUNTER — Ambulatory Visit: Payer: Managed Care, Other (non HMO) | Admitting: Podiatry

## 2020-03-31 ENCOUNTER — Ambulatory Visit (INDEPENDENT_AMBULATORY_CARE_PROVIDER_SITE_OTHER): Payer: Managed Care, Other (non HMO)

## 2020-03-31 ENCOUNTER — Other Ambulatory Visit: Payer: Self-pay | Admitting: Podiatry

## 2020-03-31 ENCOUNTER — Other Ambulatory Visit: Payer: Self-pay

## 2020-03-31 DIAGNOSIS — S99922A Unspecified injury of left foot, initial encounter: Secondary | ICD-10-CM

## 2020-03-31 DIAGNOSIS — S90122A Contusion of left lesser toe(s) without damage to nail, initial encounter: Secondary | ICD-10-CM

## 2020-03-31 NOTE — Progress Notes (Signed)
Subjective:  Patient ID: Kimberly Campbell, female    DOB: 1981-06-13,  MRN: [id number]  Chief Complaint  Patient presents with  . Toe Injury    Patient says she stumped her 3rd left toe about 1 week ago, now sore, dull ache and bruised.  . Ingrown Toenail    "my toenails are better, but the right is still a little sore and numb"    39 y.o. female presents with the above complaint.  Patient presents with a complaint of left third digit toe contusion after she stubbed it against a toy and it progressively got worse.  Patient said there was soreness bruise associated with it.  Is sore achy.  The pain has not gotten any better.  The injury happened about a week ago.  Pain scale 7 out of 10.  Is dull achy in nature.  Patient has tried buddy splinting it or taping it but has not helped.  She denies any other acute complaints.   Review of Systems: Negative except as noted in the HPI. Denies N/V/F/Ch.  Past Medical History:  Diagnosis Date  . Decreased fetal movement in pregnancy in third trimester, antepartum 02/10/2015  . Gestational diabetes   . Headache(784.0)    last one - 05/2011  . Missed abortion 12/2011   no surgery required  . No pertinent past medical history   . Uterine fibroids affecting pregnancy     Current Outpatient Medications:  .  cetirizine (ZYRTEC) 10 MG tablet, Take by mouth., Disp: , Rfl:  .  ciprofloxacin (CILOXAN) 0.3 % ophthalmic ointment, 1 to 2 drops in left eye every 2 hours while awake for 2 days then every 4 hours for 5 days, Disp: 3.5 g, Rfl: 0 .  ergocalciferol (VITAMIN D2) 1.25 MG (50000 UT) capsule, Take by mouth., Disp: , Rfl:  .  etonogestrel (NEXPLANON) 68 MG IMPL implant, 1 each by Subdermal route once., Disp: , Rfl:  .  gabapentin (NEURONTIN) 100 MG capsule, Take 1 capsule (100 mg total) by mouth at bedtime., Disp: 30 capsule, Rfl: 0 .  gentamicin cream (GARAMYCIN) 0.1 %, Apply 1 application topically 2 (two) times daily., Disp: 15 g, Rfl: 1 .   ibuprofen (ADVIL) 600 MG tablet, Take by mouth., Disp: , Rfl:  .  ketorolac (ACULAR) 0.5 % ophthalmic solution, Place 1 drop into the left eye every 6 (six) hours., Disp: 5 mL, Rfl: 0 .  meloxicam (MOBIC) 15 MG tablet, Take by mouth., Disp: , Rfl:  .  methylPREDNISolone (MEDROL DOSEPAK) 4 MG TBPK tablet, 6 day dose pack - take as directed, Disp: 21 tablet, Rfl: 0 .  oxyCODONE-acetaminophen (PERCOCET) 7.5-325 MG tablet, Take by mouth., Disp: , Rfl:  .  SUMAtriptan (IMITREX) 100 MG tablet, TAKE 1 TABLET BY MOUTH AT ONSET OF HEADACHE, MAY REPEAT IN 2 HRS. MAX 2 PER 24 HRS, Disp: , Rfl:   Social History   Tobacco Use  Smoking Status Never Smoker  Smokeless Tobacco Never Used    Allergies  Allergen Reactions  . Sulfa Antibiotics Hives   Objective:  There were no vitals filed for this visit. There is no height or weight on file to calculate BMI. Constitutional Well developed. Well nourished.  Vascular Dorsalis pedis pulses palpable bilaterally. Posterior tibial pulses palpable bilaterally. Capillary refill normal to all digits.  No cyanosis or clubbing noted. Pedal hair growth normal.  Neurologic Normal speech. Oriented to person, place, and time. Epicritic sensation to light touch grossly present bilaterally.  Dermatologic Nails well  groomed and normal in appearance. No open wounds. No skin lesions.  Orthopedic:  Pain on palpation to the left third digit circumferentially.  Mild ecchymosis noted up to the MPJ of the left third metatarsophalangeal joint.  No open wounds or lesion noted.  Nail appears to be intact without any trauma to the area.   Radiographs: 3 views of skeletally mature left foot: No osseous break or fractures noted.  Osseous alignment is within normal limits.  No bony abnormalities identified. Assessment:   1. Injury of toe on left foot, initial encounter    Plan:  Patient was evaluated and treated and all questions answered.  Left third digit contusion with  underlying ecchymosis -I explained patient the etiology of ecchymosis with history of damaging her stubbing the toe and various treatment options were discussed.  Patient will benefit from buddy splinting it aggressively as well as surgical shoe. -Patient will place her self back in the surgical shoe that she already has at home. -Ecchymosis is improving since the day of injury. -Given that there is no radiographic evidence of fractures this is likely just soft tissue contusion that will heal with in 3 to 4 weeks.  Patient states understanding.  No follow-ups on file.

## 2020-07-26 ENCOUNTER — Ambulatory Visit
Admission: EM | Admit: 2020-07-26 | Discharge: 2020-07-26 | Disposition: A | Discharge status: Home / Self Care | Payer: Managed Care, Other (non HMO) | Attending: Family Medicine | Admitting: Family Medicine

## 2020-07-26 ENCOUNTER — Other Ambulatory Visit: Payer: Self-pay

## 2020-07-26 DIAGNOSIS — R739 Hyperglycemia, unspecified: Secondary | ICD-10-CM | POA: Insufficient documentation

## 2020-07-26 DIAGNOSIS — M79671 Pain in right foot: Secondary | ICD-10-CM | POA: Insufficient documentation

## 2020-07-26 DIAGNOSIS — M79672 Pain in left foot: Secondary | ICD-10-CM | POA: Insufficient documentation

## 2020-07-26 LAB — HEMOGLOBIN A1C
Hgb A1c MFr Bld: 5.7 % — ABNORMAL HIGH (ref 4.8–5.6)
Mean Plasma Glucose: 116.89 mg/dL

## 2020-07-26 LAB — GLUCOSE, CAPILLARY: Glucose-Capillary: 131 mg/dL — ABNORMAL HIGH (ref 70–99)

## 2020-07-26 MED ORDER — GABAPENTIN 300 MG PO CAPS: 300.0000 mg | ORAL_CAPSULE | Freq: Every day | ORAL | 1 refills | Status: AC

## 2020-07-26 NOTE — ED Triage Notes (Signed)
Patient complains of bilateral foot tingling over the last 2 months. States that she had gestational diabetes in 2016 and is concerned that she may be diabetic now. States that her entire feet hurt often.

## 2020-07-26 NOTE — ED Provider Notes (Signed)
[facility]    CSN: [id number] Arrival date & time: 07/26/20  0831      History   Chief Complaint Chief Complaint  Patient presents with  . Foot Pain    bilateral   HPI  39 year old female presents with the above complaint.  Patient reports that she has had pain in both of her feet for the past year.  Worse over the past month.  She reports pain and associated tingling.  Patient states that she has a history of gestational diabetes.  She is concerned that she may have diabetes and this is contributing to her symptoms.  Worse at night.  Denies back pain.  Pain 5/10 in severity.  No relieving factors.  No fall, trauma, injury.  No other complaints.   Past Medical History:  Diagnosis Date  . Decreased fetal movement in pregnancy in third trimester, antepartum 02/10/2015  . Gestational diabetes   . Headache(784.0)    last one - 05/2011  . Missed abortion 12/2011   no surgery required  . No pertinent past medical history   . Uterine fibroids affecting pregnancy     Patient Active Problem List   Diagnosis Date Noted  . OSA (obstructive sleep apnea) 09/24/2017  . Pregnancy 02/22/2015  . Pregnancy w/ hx of uterine myomectomy 02/19/2015  . Previous cesarean section 02/19/2015  . GDM, class A2-insulin 02/19/2015  . Thrombocytopenia affecting pregnancy, antepartum ([place]) 02/19/2015  . Postpartum care following cesarean delivery (6/16) 02/19/2015  . Decreased fetal movement in pregnancy in third trimester, antepartum 02/10/2015    Past Surgical History:  Procedure Laterality Date  . CESAREAN SECTION    . CESAREAN SECTION N/A 01/27/2014   Procedure: CESAREAN SECTION;  Surgeon: Claiborne Billings A. Pamala Hurry, MD;  Location: [facility];  Service: Obstetrics;  Laterality: N/A;  . CESAREAN SECTION N/A 02/19/2015   Procedure: Repeat CESAREAN SECTION;  Surgeon: Brien Few, MD;  Location: [facility];  Service: Obstetrics;  Laterality: N/A;  EDD: 03/16/15  . LEEP    . MYOMECTOMY    . ROBOT  ASSISTED MYOMECTOMY  04/17/2012   Procedure: ROBOTIC ASSISTED MYOMECTOMY;  Surgeon: Princess Bruins, MD;  Location: [facility];  Service: Gynecology;  Laterality: N/A;  . WISDOM TOOTH EXTRACTION      OB History    Gravida  6   Para  2   Term  1   Preterm  1   AB  1   Living  2     SAB  1   TAB  0   Ectopic  0   Multiple      Live Births  2            Home Medications    Prior to Admission medications   Medication Sig Start Date End Date Taking? Authorizing Provider  etonogestrel (NEXPLANON) 68 MG IMPL implant 1 each by Subdermal route once.   Yes [provider]  gabapentin (NEURONTIN) 300 MG capsule Take 1 capsule (300 mg total) by mouth at bedtime. 07/26/20   Coral Spikes, DO  SUMAtriptan (IMITREX) 100 MG tablet TAKE 1 TABLET BY MOUTH AT ONSET OF HEADACHE, MAY REPEAT IN 2 HRS. MAX 2 PER 24 HRS 05/12/12   [provider]  cetirizine (ZYRTEC) 10 MG tablet Take by mouth.  07/26/20  [provider]    Family History Family History  Problem Relation Age of Onset  . Hypertension Mother   . Diabetes Father   . Cancer Father   . Breast  cancer Maternal Aunt     Social History Social History   Tobacco Use  . Smoking status: Never Smoker  . Smokeless tobacco: Never Used  Vaping Use  . Vaping Use: Never used  Substance Use Topics  . Alcohol use: Yes    Comment: rarely  . Drug use: No     Allergies   Sulfa antibiotics   Review of Systems Review of Systems Per HPI  Physical Exam Triage Vital Signs ED Triage Vitals  Enc Vitals Group     BP 07/26/20 0845 (!) 141/84     Pulse Rate 07/26/20 0845 84     Resp 07/26/20 0845 18     Temp 07/26/20 0845 98.4 F (36.9 C)     Temp Source 07/26/20 0845 Oral     SpO2 07/26/20 0845 100 %     Weight 07/26/20 0843 240 lb (108.9 kg)     Height 07/26/20 0843 5\' 7"  (1.702 m)     Head Circumference --      Peak Flow --      Pain Score 07/26/20 0843 5     Pain Loc --      Pain Edu? --       Excl. in [place]? --    Updated Vital Signs BP (!) 141/84 (BP Location: Right Arm)   Pulse 84   Temp 98.4 F (36.9 C) (Oral)   Resp 18   Ht 5\' 7"  (1.702 m)   Wt 108.9 kg   SpO2 100%   BMI 37.59 kg/m   Visual Acuity Right Eye Distance:   Left Eye Distance:   Bilateral Distance:    Right Eye Near:   Left Eye Near:    Bilateral Near:     Physical Exam Constitutional:      General: She is not in acute distress.    Appearance: Normal appearance. She is obese. She is not ill-appearing.  HENT:     Head: Normocephalic and atraumatic.  Eyes:     General:        Right eye: No discharge.        Left eye: No discharge.     Conjunctiva/sclera: Conjunctivae normal.  Cardiovascular:     Pulses:          Dorsalis pedis pulses are 2+ on the right side and 2+ on the left side.       Posterior tibial pulses are 2+ on the right side and 2+ on the left side.  Pulmonary:     Effort: Pulmonary effort is normal. No respiratory distress.  Feet:     Right foot:     Protective Sensation: 5 sites tested. 5 sites sensed.     Skin integrity: Skin integrity normal.     Toenail Condition: Right toenails are normal.     Left foot:     Protective Sensation: 5 sites tested. 5 sites sensed.     Skin integrity: Skin integrity normal.     Toenail Condition: Left toenails are normal.     Comments: No discrete areas of tenderness of the right or left foot. Neurological:     Mental Status: She is alert.  Psychiatric:        Mood and Affect: Mood normal.    UC Treatments / Results  Labs (all labs ordered are listed, but only abnormal results are displayed) Labs Reviewed  GLUCOSE, CAPILLARY - Abnormal; Notable for the following components:      Result Value   Glucose-Capillary 131 (*)  All other components within normal limits  HEMOGLOBIN A1C  CBG MONITORING, ED    EKG   Radiology No results found.  Procedures Procedures (including critical care time)  Medications Ordered in  UC Medications - No data to display  Initial Impression / Assessment and Plan / UC Course  I have reviewed the triage vital signs and the nursing notes.  Pertinent labs & imaging results that were available during my care of the patient were reviewed by me and considered in my medical decision making (see chart for details).    39 year old female presents with bilateral foot pain.  Glucose mildly elevated at 131 today.  Awaiting A1c.  I doubt that this is diabetic neuropathy.  Possible radiculopathy.  Placing on gabapentin.  Advised primary care follow-up.  Final Clinical Impressions(s) / UC Diagnoses   Final diagnoses:  Bilateral foot pain  Blood glucose elevated     Discharge Instructions     Glucose was slightly elevated.  Awaiting A1C results.  Medication as prescribed.  Please get yourself a primary care physician.  Take care  Dr. Lacinda Axon    ED Prescriptions    Medication Sig Dispense Auth. Provider   gabapentin (NEURONTIN) 300 MG capsule Take 1 capsule (300 mg total) by mouth at bedtime. 90 capsule Thersa Salt G, DO     PDMP not reviewed this encounter.   Coral Spikes, DO 07/26/20 1004

## 2020-07-26 NOTE — Discharge Instructions (Signed)
Glucose was slightly elevated.  Awaiting A1C results.  Medication as prescribed.  Please get yourself a primary care physician.  Take care  Dr. Lacinda Axon

## 2020-10-26 ENCOUNTER — Other Ambulatory Visit: Payer: Self-pay | Admitting: Podiatry

## 2020-10-26 NOTE — Telephone Encounter (Signed)
Please advise 

## 2021-04-21 ENCOUNTER — Other Ambulatory Visit: Payer: Self-pay

## 2021-04-21 ENCOUNTER — Ambulatory Visit
Admission: EM | Admit: 2021-04-21 | Discharge: 2021-04-21 | Disposition: A | Discharge status: Home / Self Care | Payer: Managed Care, Other (non HMO) | Attending: Sports Medicine | Admitting: Sports Medicine

## 2021-04-21 DIAGNOSIS — R519 Headache, unspecified: Secondary | ICD-10-CM

## 2021-04-21 DIAGNOSIS — F419 Anxiety disorder, unspecified: Secondary | ICD-10-CM | POA: Diagnosis not present

## 2021-04-21 MED ORDER — SUMATRIPTAN SUCCINATE 100 MG PO TABS: ORAL_TABLET | ORAL | 0 refills | Status: DC

## 2021-04-21 NOTE — Discharge Instructions (Addendum)
As we discussed, your blood pressure is very reassuring and does not require any medication. Your neurological exam is also within normal limits. I am going to go ahead and give you a few pills of Imitrex as a prescription as you have been on this in the past. Please see educational handouts. Tylenol or Motrin for any discomfort. Encourage you to make a appointment with your primary care provider for next week. If your symptoms were to worsen in any way or you develop any nausea, vomiting, or the worst headache in your life, or any vision changes then I want you to call 911 and go directly to the nearest emergency room.

## 2021-04-21 NOTE — ED Triage Notes (Signed)
Pt c/o recent headache, mostly on the right side. Pt had bladder infection, fever and elevated BP. Pt has been treated for this and has improved. Pt states during this she was having right-sided back pain, is concerned since the headache is also on the right side. Pt is worried about a blood clot forming. Pt also would like to check her BP to see if this has improved. Pt is asking for meds for the headache. Pt has taken Advil/Ibuprofen with no relief. Pt does have history of migraines, does not currently have any meds for these.

## 2021-04-21 NOTE — ED Provider Notes (Signed)
[facility]    CSN: [id number] Arrival date & time: 04/21/21  1051      History   Chief Complaint Chief Complaint  Patient presents with   Headache   Hypertension    HPI Kimberly Campbell is a 40 y.o. female.   40 year old female who presents for evaluation of concern for her blood pressure.  She also reports having headache mostly on the right side.  She denies any vision changes.  No painful vision, double vision, or blurry vision.  No nausea vomiting or diarrhea.  She attributes the headache to a potential UTI that she is currently being treated for.  Her urinary symptoms have improved.  She is on day 3 of 7 and is taking ciprofloxacin.  She was seen up in [place] and had that medicine prescribed.  I will have access to those medical records.  I did review her medical chart in detail and she does suffer from significant anxiety at times and depression.  Her primary care provider is in [place].  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  Complicating her situation she has a history of migraines that she suffered from when she was younger but they seem to resolve in her 75s.  She has taken Imitrex in the past but does not have a current prescription for that.  Her headache began yesterday, and it persisted today so she comes into the urgent care.  No chest pain shortness of breath.  No neurological issues on history.  No red flag signs or symptoms elicited on history.   Past Medical History:  Diagnosis Date   Decreased fetal movement in pregnancy in third trimester, antepartum 02/10/2015   Gestational diabetes    Headache(784.0)    last one - 05/2011   Missed abortion 12/2011   no surgery required   No pertinent past medical history    Uterine fibroids affecting pregnancy     Patient Active Problem List   Diagnosis Date Noted   OSA (obstructive sleep apnea) 09/24/2017   Pregnancy 02/22/2015   Pregnancy w/ hx of uterine myomectomy 02/19/2015   Previous  cesarean section 02/19/2015   GDM, class A2-insulin 02/19/2015   Thrombocytopenia affecting pregnancy, antepartum ([place]) 02/19/2015   Postpartum care following cesarean delivery (6/16) 02/19/2015   Decreased fetal movement in pregnancy in third trimester, antepartum 02/10/2015    Past Surgical History:  Procedure Laterality Date   CESAREAN SECTION     CESAREAN SECTION N/A 01/27/2014   Procedure: CESAREAN SECTION;  Surgeon: Claiborne Billings A. Pamala Hurry, MD;  Location: [facility];  Service: Obstetrics;  Laterality: N/A;   CESAREAN SECTION N/A 02/19/2015   Procedure: Repeat CESAREAN SECTION;  Surgeon: Brien Few, MD;  Location: [facility];  Service: Obstetrics;  Laterality: N/A;  EDD: 03/16/15   LEEP     MYOMECTOMY     ROBOT ASSISTED MYOMECTOMY  04/17/2012   Procedure: ROBOTIC ASSISTED MYOMECTOMY;  Surgeon: Princess Bruins, MD;  Location: [facility];  Service: Gynecology;  Laterality: N/A;   WISDOM TOOTH EXTRACTION      OB History     Gravida  6   Para  2   Term  1   Preterm  1   AB  1   Living  2      SAB  1   IAB  0   Ectopic  0   Multiple      Live Births  2  Home Medications    Prior to Admission medications   Medication Sig Start Date End Date Taking? Authorizing Provider  ciprofloxacin (CIPRO) 500 MG tablet Take 500 mg by mouth 2 (two) times daily. 04/19/21  Yes [provider]  escitalopram (LEXAPRO) 20 MG tablet Take by mouth. 04/18/21  Yes [provider]  etonogestrel (NEXPLANON) 68 MG IMPL implant 1 each by Subdermal route once.   Yes [provider]  gabapentin (NEURONTIN) 300 MG capsule Take 1 capsule (300 mg total) by mouth at bedtime. 07/26/20  Yes Cook, Jayce G, DO  ibuprofen (ADVIL) 800 MG tablet Take 800 mg by mouth 3 (three) times daily. 04/19/21  Yes [provider]  ondansetron (ZOFRAN-ODT) 4 MG disintegrating tablet Take by mouth. 04/19/21  Yes [provider]  SUMAtriptan (IMITREX) 100 MG tablet TAKE 1  TABLET BY MOUTH AT ONSET OF HEADACHE, MAY REPEAT IN 2 HRS. MAX 2 PER 24 HRS 04/21/21   Verda Cumins, MD  cetirizine (ZYRTEC) 10 MG tablet Take by mouth.  07/26/20  [provider]    Family History Family History  Problem Relation Age of Onset   Hypertension Mother    Diabetes Father    Cancer Father    Breast cancer Maternal Aunt     Social History Social History   Tobacco Use   Smoking status: Never   Smokeless tobacco: Never  Vaping Use   Vaping Use: Never used  Substance Use Topics   Alcohol use: Yes    Comment: rarely   Drug use: No     Allergies   Sulfa antibiotics   Review of Systems Review of Systems  Constitutional:  Negative for activity change, appetite change, chills, diaphoresis, fatigue and fever.  HENT:  Negative for congestion, dental problem, ear pain, postnasal drip, rhinorrhea, sinus pressure, sinus pain, sneezing and sore throat.   Eyes:  Negative for pain.  Respiratory:  Negative for cough, chest tightness and shortness of breath.   Cardiovascular:  Negative for chest pain and palpitations.  Gastrointestinal:  Negative for abdominal pain, diarrhea, nausea and vomiting.  Genitourinary:  Negative for dysuria.  Musculoskeletal:  Negative for back pain, myalgias and neck pain.  Skin:  Negative for color change, pallor, rash and wound.  Neurological:  Positive for headaches. Negative for dizziness, seizures, syncope, speech difficulty, light-headedness and numbness.  All other systems reviewed and are negative.   Physical Exam Triage Vital Signs ED Triage Vitals  Enc Vitals Group     BP 04/21/21 1116 130/80     Pulse Rate 04/21/21 1116 83     Resp 04/21/21 1116 18     Temp 04/21/21 1116 98.1 F (36.7 C)     Temp Source 04/21/21 1116 Oral     SpO2 04/21/21 1116 100 %     Weight 04/21/21 1113 248 lb (112.5 kg)     Height 04/21/21 1113 '5\' 7"'$  (1.702 m)     Head Circumference --      Peak Flow --      Pain Score 04/21/21 1113 9      Pain Loc --      Pain Edu? --      Excl. in [place]? --    No data found.  Updated Vital Signs BP 128/78 (BP Location: Right Arm)   Pulse 83   Temp 98.1 F (36.7 C) (Oral)   Resp 18   Ht '5\' 7"'$  (1.702 m)   Wt 112.5 kg   SpO2 100%  BMI 38.84 kg/m   Visual Acuity Right Eye Distance:   Left Eye Distance:   Bilateral Distance:    Right Eye Near:   Left Eye Near:    Bilateral Near:     Physical Exam Vitals and nursing note reviewed.  Constitutional:      General: She is not in acute distress.    Appearance: Normal appearance. She is well-developed. She is not ill-appearing, toxic-appearing or diaphoretic.  HENT:     Head: Normocephalic and atraumatic.     Nose: Nose normal.     Mouth/Throat:     Mouth: Mucous membranes are moist.  Eyes:     General: No scleral icterus.    Extraocular Movements: Extraocular movements intact.     Right eye: Normal extraocular motion and no nystagmus.     Left eye: Normal extraocular motion and no nystagmus.     Conjunctiva/sclera: Conjunctivae normal.     Pupils: Pupils are equal, round, and reactive to light. Pupils are equal.     Right eye: Pupil is round and reactive.     Left eye: Pupil is round and reactive.  Neck:     Meningeal: Brudzinski's sign and Kernig's sign absent.  Cardiovascular:     Rate and Rhythm: Normal rate and regular rhythm.     Pulses: Normal pulses.     Heart sounds: Normal heart sounds. No murmur heard.   No friction rub. No gallop.  Pulmonary:     Effort: Pulmonary effort is normal.     Breath sounds: Normal breath sounds. No stridor. No wheezing, rhonchi or rales.  Musculoskeletal:     Cervical back: Normal range of motion and neck supple. No rigidity.  Skin:    General: Skin is warm and dry.     Capillary Refill: Capillary refill takes less than 2 seconds.  Neurological:     General: No focal deficit present.     Mental Status: She is alert and oriented to person, place, and time.     GCS: GCS eye  subscore is 4. GCS verbal subscore is 5. GCS motor subscore is 6.     Cranial Nerves: Cranial nerves are intact.     Sensory: Sensation is intact.     Motor: Motor function is intact.     Coordination: Coordination is intact.     Deep Tendon Reflexes: Reflexes normal.     UC Treatments / Results  Labs (all labs ordered are listed, but only abnormal results are displayed) Labs Reviewed - No data to display  EKG   Radiology No results found.  Procedures Procedures (including critical care time)  Medications Ordered in UC Medications - No data to display  Initial Impression / Assessment and Plan / UC Course  I have reviewed the triage vital signs and the nursing notes.  Pertinent labs & imaging results that were available during my care of the patient were reviewed by me and considered in my medical decision making (see chart for details).  Clinical impression: 1.  Acute non-intractable headache mostly on the right side. 2.  Anxiety 3.  Concern for high blood pressure but blood pressure is normal today. 4.  Recent diagnosis of UTI, currently on antibiotics, and currently asymptomatic. 5.  Reassuring neurological examination  Treatment plan: 1.  The findings and treatment plan were discussed in detail with the patient.  Patient was in agreement. 2.  I reassured her that her blood pressure was normal.  We did check it in both arms  and the reading on the contralateral arm was 130/80 and the one recorded in the medical record is 128/70. 3.  Given that she does have a history of migraines I went ahead and gave her some Imitrex.  Sent that to her pharmacy. 4.  Educational handouts provided. 5.  I encouraged her to contact her primary care provider and be seen within a week just to ensure that she is continuing to do better. 6.  She can use over-the-counter meds such as Tylenol or Motrin for any discomfort does not working with the Imitrex. 7.  I did indicate that if the symptoms  were to worsen in any way as she developed the worst headache of her life, nausea, vomiting, or vision changes then she should call 911 and seek care immediately at the closest emergency room. 8.  She was discharged in stable condition will follow-up with her primary care provider.  She will follow-up here as needed.    Final Clinical Impressions(s) / UC Diagnoses   Final diagnoses:  Acute nonintractable headache, unspecified headache type  Anxiety     Discharge Instructions      As we discussed, your blood pressure is very reassuring and does not require any medication. Your neurological exam is also within normal limits. I am going to go ahead and give you a few pills of Imitrex as a prescription as you have been on this in the past. Please see educational handouts. Tylenol or Motrin for any discomfort. Encourage you to make a appointment with your primary care provider for next week. If your symptoms were to worsen in any way or you develop any nausea, vomiting, or the worst headache in your life, or any vision changes then I want you to call 911 and go directly to the nearest emergency room.     ED Prescriptions     Medication Sig Dispense Auth. Provider   SUMAtriptan (IMITREX) 100 MG tablet TAKE 1 TABLET BY MOUTH AT ONSET OF HEADACHE, MAY REPEAT IN 2 HRS. MAX 2 PER 24 HRS 10 tablet Verda Cumins, MD      PDMP not reviewed this encounter.   Verda Cumins, MD 04/21/21 1215

## 2021-09-03 ENCOUNTER — Ambulatory Visit: Payer: Managed Care, Other (non HMO) | Admitting: Podiatry

## 2021-09-10 ENCOUNTER — Ambulatory Visit: Payer: Managed Care, Other (non HMO) | Admitting: Podiatry

## 2021-10-27 ENCOUNTER — Other Ambulatory Visit: Payer: Self-pay | Admitting: Certified Nurse Midwife

## 2021-10-27 DIAGNOSIS — Z1231 Encounter for screening mammogram for malignant neoplasm of breast: Secondary | ICD-10-CM

## 2022-01-13 ENCOUNTER — Ambulatory Visit
Admission: RE | Admit: 2022-01-13 | Discharge: 2022-01-13 | Disposition: A | Discharge status: Home / Self Care | Payer: Managed Care, Other (non HMO) | Source: Ambulatory Visit | Attending: Certified Nurse Midwife | Admitting: Certified Nurse Midwife

## 2022-01-13 DIAGNOSIS — Z1231 Encounter for screening mammogram for malignant neoplasm of breast: Secondary | ICD-10-CM | POA: Diagnosis present

## 2022-06-10 ENCOUNTER — Ambulatory Visit: Payer: Managed Care, Other (non HMO)

## 2022-06-10 ENCOUNTER — Ambulatory Visit: Payer: Managed Care, Other (non HMO) | Admitting: Podiatry

## 2022-06-10 DIAGNOSIS — L600 Ingrowing nail: Secondary | ICD-10-CM

## 2022-06-10 DIAGNOSIS — M5416 Radiculopathy, lumbar region: Secondary | ICD-10-CM

## 2022-06-10 MED ORDER — MELOXICAM 15 MG PO TABS: 15.0000 mg | ORAL_TABLET | Freq: Every day | ORAL | 1 refills | Status: AC

## 2022-06-10 NOTE — Progress Notes (Signed)
   Chief Complaint  Patient presents with   Foot Pain   Ingrown Toenail    Patient is here for bilateral foot pain, ingrown toe nail on the right foot.    Subjective: Patient presents today for evaluation of pain to the lateral border right great toe. Patient is concerned for possible ingrown nail.  It is very sensitive to touch.  Patient presents today for further treatment and evaluation.  Past Medical History:  Diagnosis Date   Decreased fetal movement in pregnancy in third trimester, antepartum 02/10/2015   Gestational diabetes    Headache(784.0)    last one - 05/2011   Missed abortion 12/2011   no surgery required   No pertinent past medical history    Uterine fibroids affecting pregnancy     Objective:  General: Well developed, nourished, in no acute distress, alert and oriented x3   Dermatology: Skin is warm, dry and supple bilateral.  Lateral border right great toe is tender with evidence of an ingrowing nail. Pain on palpation noted to the border of the nail fold. The remaining nails appear unremarkable at this time. There are no open sores, lesions.  Vascular: DP and PT pulses palpable.  No clinical evidence of vascular compromise  Neruologic: Grossly intact via light touch bilateral.  Musculoskeletal: No pedal deformity noted.  Generalized foot pain noted bilateral feet secondary to lumbar radiculopathy  Assesement: #1 Paronychia with ingrowing nail lateral border right great toe #2 lumbar radiculopathy bilateral  Plan of Care:  1. Patient evaluated.  2. Discussed treatment alternatives and plan of care. Explained nail avulsion procedure and post procedure course to patient. 3. Patient opted for permanent partial nail avulsion of the ingrown portion of the nail.  4. Prior to procedure, local anesthesia infiltration utilized using 3 ml of a 50:50 mixture of 2% plain lidocaine and 0.5% plain marcaine in a normal hallux block fashion and a betadine prep performed.  5.  Partial permanent nail avulsion with chemical matrixectomy performed using [id number] applications of phenol followed by alcohol flush.  6. Light dressing applied.  Post care instructions provided 7.  Patient has prescription strength antibiotic ointment at home.  Continue daily 8.  Prescription for meloxicam 15 mg daily 9.  Patient has an active prescription for gabapentin.  Continue as prescribed  10.  Return to clinic 2 weeks.  Edrick Kins, DPM [facility]  Dr. Edrick Kins, DPM    [address], [place] [address]                Office [phone]  Fax [phone]

## 2022-07-01 ENCOUNTER — Ambulatory Visit: Payer: Managed Care, Other (non HMO) | Admitting: Podiatry

## 2022-07-15 ENCOUNTER — Encounter: Payer: Self-pay | Admitting: Podiatry

## 2022-07-15 ENCOUNTER — Ambulatory Visit (INDEPENDENT_AMBULATORY_CARE_PROVIDER_SITE_OTHER): Payer: Managed Care, Other (non HMO)

## 2022-07-15 ENCOUNTER — Ambulatory Visit: Payer: Managed Care, Other (non HMO) | Admitting: Podiatry

## 2022-07-15 DIAGNOSIS — S92402A Displaced unspecified fracture of left great toe, initial encounter for closed fracture: Secondary | ICD-10-CM

## 2022-07-15 NOTE — Progress Notes (Signed)
Chief Complaint  Patient presents with   Foot Pain    "I fell and hurt my foot about two weeks ago." N - pain in my big toe and across over to my little toe L - plantar hallux, 2-5 mpj area D - 2-2.5 weeks O - suddenly C - throbbing, sharp pains A - shoes, walking makes worse, sheets at night T - ice, elevate, Aleve, wore a boot                    HPI: 41 y.o. female presenting today for evaluation of an injury to the left great toe that happened about 2-1/2 weeks ago.  Patient states that she went to her PCP at [facility] primary care and was only diagnosed with a contusion of the toe.  She continues to have pain and tenderness.  She has been taking ibuprofen as needed.  Past Medical History:  Diagnosis Date   Decreased fetal movement in pregnancy in third trimester, antepartum 02/10/2015   Gestational diabetes    Headache(784.0)    last one - 05/2011   Missed abortion 12/2011   no surgery required   No pertinent past medical history    Uterine fibroids affecting pregnancy     Past Surgical History:  Procedure Laterality Date   CESAREAN SECTION     CESAREAN SECTION N/A 01/27/2014   Procedure: CESAREAN SECTION;  Surgeon: Claiborne Billings A. Pamala Hurry, MD;  Location: [facility];  Service: Obstetrics;  Laterality: N/A;   CESAREAN SECTION N/A 02/19/2015   Procedure: Repeat CESAREAN SECTION;  Surgeon: Brien Few, MD;  Location: [facility];  Service: Obstetrics;  Laterality: N/A;  EDD: 03/16/15   LEEP     MYOMECTOMY     ROBOT ASSISTED MYOMECTOMY  04/17/2012   Procedure: ROBOTIC ASSISTED MYOMECTOMY;  Surgeon: Princess Bruins, MD;  Location: [facility];  Service: Gynecology;  Laterality: N/A;   WISDOM TOOTH EXTRACTION      Allergies  Allergen Reactions   Sulfa Antibiotics Hives     Physical Exam: General: The patient is alert and oriented x3 in no acute distress.  Dermatology: Skin is warm, dry and supple bilateral lower extremities. Negative for open lesions or macerations.  There is  some very mild ecchymosis diffusely throughout the forefoot  Vascular: Palpable pedal pulses bilaterally. Capillary refill within normal limits.  Negative for any significant edema or erythema  Neurological: Light touch and protective threshold grossly intact  Musculoskeletal Exam: No pedal deformities noted.  Gross alignment of the toes noted.  There is some tenderness with palpation to the great toe   Radiographic Exam LT foot 07/15/2022:  Subtle fracture best visualized on medial oblique view of the proximal phalanx of the left great toe extending into the IPJ.  Nondisplaced, closed  Assessment: 1.  Closed nondisplaced fracture proximal phalanx left great toe   Plan of Care:  1. Patient evaluated. X-Rays reviewed.   2.  Continue conservative treatment including rest, ice, elevation, and wearing a cam boot or good supportive sneakers and shoes. 3.  Return to clinic 6 weeks for follow-up x-ray      Edrick Kins, DPM [facility]  Dr. Edrick Kins, DPM    [address], [place] [address]  Office [phone]  Fax [phone]

## 2023-01-18 ENCOUNTER — Other Ambulatory Visit: Payer: Self-pay | Admitting: Family Medicine

## 2023-01-18 DIAGNOSIS — Z1231 Encounter for screening mammogram for malignant neoplasm of breast: Secondary | ICD-10-CM

## 2023-02-22 ENCOUNTER — Ambulatory Visit
Admission: RE | Admit: 2023-02-22 | Discharge: 2023-02-22 | Disposition: A | Discharge status: Home / Self Care | Payer: Managed Care, Other (non HMO) | Source: Ambulatory Visit | Attending: Family Medicine | Admitting: Family Medicine

## 2023-02-22 DIAGNOSIS — Z1231 Encounter for screening mammogram for malignant neoplasm of breast: Secondary | ICD-10-CM | POA: Diagnosis present

## 2023-02-28 ENCOUNTER — Other Ambulatory Visit: Payer: Self-pay | Admitting: Podiatry

## 2023-04-06 ENCOUNTER — Ambulatory Visit
Admission: EM | Admit: 2023-04-06 | Discharge: 2023-04-06 | Disposition: A | Discharge status: Home / Self Care | Payer: Managed Care, Other (non HMO) | Attending: Emergency Medicine | Admitting: Emergency Medicine

## 2023-04-06 DIAGNOSIS — Z1152 Encounter for screening for COVID-19: Secondary | ICD-10-CM | POA: Diagnosis not present

## 2023-04-06 DIAGNOSIS — K529 Noninfective gastroenteritis and colitis, unspecified: Secondary | ICD-10-CM | POA: Diagnosis not present

## 2023-04-06 LAB — SARS CORONAVIRUS 2 BY RT PCR: SARS Coronavirus 2 by RT PCR: NEGATIVE

## 2023-04-06 MED ORDER — DICYCLOMINE HCL 20 MG PO TABS: 20.0000 mg | ORAL_TABLET | Freq: Two times a day (BID) | ORAL | 0 refills | Status: DC

## 2023-04-06 MED ORDER — ONDANSETRON 8 MG PO TBDP: 8.0000 mg | ORAL_TABLET | Freq: Three times a day (TID) | ORAL | 0 refills | Status: DC | PRN

## 2023-04-06 NOTE — ED Provider Notes (Signed)
[facility]    CSN: [id number] Arrival date & time: 04/06/23  0809      History   Chief Complaint Chief Complaint  Patient presents with   Nausea   Fatigue   Diarrhea        Abdominal Pain    HPI Kimberly Campbell is a 42 y.o. female.   HPI  42 year old female with a past medical history significant for headaches, uterine fibroids, and OSA presents for evaluation of GI complaints which started 2 days ago.  At the outset she had a fever of 101 but has not had a fever since.  She has been nauseous but she has been able to keep down fluids.  She describes abdominal cramping that will be followed by diarrhea.  She had a large number of stools the first day, her symptoms improved yesterday, and then the diarrhea returned this morning.  She denies any abdominal pain, runny nose, nasal congestion, ear pain, sore throat, or cough.  She denies any blood in her stool.  She was at the hospital with her mother earlier in the week who had similar symptoms but hers were secondary to gallstones.  She was able to eat yesterday and food did not cause any pain.  Past Medical History:  Diagnosis Date   Decreased fetal movement in pregnancy in third trimester, antepartum 02/10/2015   Gestational diabetes    Headache(784.0)    last one - 05/2011   Missed abortion 12/2011   no surgery required   No pertinent past medical history    Uterine fibroids affecting pregnancy     Patient Active Problem List   Diagnosis Date Noted   OSA (obstructive sleep apnea) 09/24/2017   Pregnancy 02/22/2015   Pregnancy w/ hx of uterine myomectomy 02/19/2015   Previous cesarean section 02/19/2015   GDM, class A2-insulin 02/19/2015   Thrombocytopenia affecting pregnancy, antepartum (HCC) 02/19/2015   Postpartum care following cesarean delivery (6/16) 02/19/2015   Decreased fetal movement in pregnancy in third trimester, antepartum 02/10/2015    Past Surgical History:  Procedure Laterality Date    CESAREAN SECTION     CESAREAN SECTION N/A 01/27/2014   Procedure: CESAREAN SECTION;  Surgeon: Tresa Endo A. Ernestina Penna, MD;  Location: [facility];  Service: Obstetrics;  Laterality: N/A;   CESAREAN SECTION N/A 02/19/2015   Procedure: Repeat CESAREAN SECTION;  Surgeon: Olivia Mackie, MD;  Location: [facility];  Service: Obstetrics;  Laterality: N/A;  EDD: 03/16/15   LEEP     MYOMECTOMY     ROBOT ASSISTED MYOMECTOMY  04/17/2012   Procedure: ROBOTIC ASSISTED MYOMECTOMY;  Surgeon: Genia Del, MD;  Location: [facility];  Service: Gynecology;  Laterality: N/A;   WISDOM TOOTH EXTRACTION      OB History     Gravida  6   Para  2   Term  1   Preterm  1   AB  1   Living  2      SAB  1   IAB  0   Ectopic  0   Multiple      Live Births  2            Home Medications    Prior to Admission medications   Medication Sig Start Date End Date Taking? Authorizing Provider  dicyclomine (BENTYL) 20 MG tablet Take 1 tablet (20 mg total) by mouth 2 (two) times daily. 04/06/23  Yes Becky Augusta, NP  escitalopram (LEXAPRO) 20 MG tablet Take by mouth. 04/18/21  Yes  [provider]  gabapentin (NEURONTIN) 300 MG capsule Take 1 capsule (300 mg total) by mouth at bedtime. 07/26/20  Yes Cook, Jayce G, DO  meloxicam (MOBIC) 15 MG tablet Take 1 tablet (15 mg total) by mouth daily. 06/10/22  Yes Felecia Shelling, DPM  MOUNJARO 10 MG/0.5ML Pen SMARTSIG:10 Milligram(s) SUB-Q Once a Week   Yes [provider]  ondansetron (ZOFRAN-ODT) 8 MG disintegrating tablet Take 1 tablet (8 mg total) by mouth every 8 (eight) hours as needed for nausea or vomiting. 04/06/23  Yes Becky Augusta, NP  pravastatin (PRAVACHOL) 20 MG tablet Take 1 tablet by mouth at bedtime. 01/17/23 01/17/24 Yes [provider]  ciprofloxacin (CIPRO) 500 MG tablet Take 500 mg by mouth 2 (two) times daily. Patient not taking: Reported on 06/10/2022 04/19/21   [provider]  etonogestrel (NEXPLANON) 68 MG IMPL implant 1 each  by Subdermal route once. Patient not taking: Reported on 06/10/2022    [provider]  ibuprofen (ADVIL) 800 MG tablet Take 800 mg by mouth 3 (three) times daily. Patient not taking: Reported on 06/10/2022 04/19/21   [provider]  SUMAtriptan (IMITREX) 100 MG tablet TAKE 1 TABLET BY MOUTH AT ONSET OF HEADACHE, MAY REPEAT IN 2 HRS. MAX 2 PER 24 HRS Patient not taking: Reported on 06/10/2022 04/21/21   Delton See, MD  cetirizine (ZYRTEC) 10 MG tablet Take by mouth.  07/26/20  [provider]    Family History Family History  Problem Relation Age of Onset   Hypertension Mother    Diabetes Father    Cancer Father    Breast cancer Maternal Aunt     Social History Social History   Tobacco Use   Smoking status: Never   Smokeless tobacco: Never  Vaping Use   Vaping status: Never Used  Substance Use Topics   Alcohol use: Not Currently    Comment: rarely   Drug use: No     Allergies   Sulfa antibiotics   Review of Systems Review of Systems  Constitutional:  Positive for fever.  HENT:  Negative for congestion, ear pain, rhinorrhea and sore throat.   Respiratory:  Negative for cough.   Gastrointestinal:  Positive for diarrhea and nausea. Negative for abdominal pain, blood in stool and vomiting.     Physical Exam Triage Vital Signs ED Triage Vitals  Encounter Vitals Group     BP      Systolic BP Percentile      Diastolic BP Percentile      Pulse      Resp      Temp      Temp src      SpO2      Weight      Height      Head Circumference      Peak Flow      Pain Score      Pain Loc      Pain Education      Exclude from Growth Chart    No data found.  Updated Vital Signs BP (!) 137/94 (BP Location: Right Arm)   Pulse 96   Temp 98.4 F (36.9 C) (Oral)   Resp 16   Ht 5\' 7"  (1.702 m)   Wt 230 lb (104.3 kg)   SpO2 98%   BMI 36.02 kg/m   Visual Acuity Right Eye Distance:   Left Eye Distance:   Bilateral Distance:    Right  Eye Near:   Left Eye Near:  Bilateral Near:     Physical Exam Vitals and nursing note reviewed.  Constitutional:      Appearance: Normal appearance. She is not ill-appearing.  HENT:     Head: Normocephalic and atraumatic.  Cardiovascular:     Rate and Rhythm: Normal rate and regular rhythm.     Pulses: Normal pulses.     Heart sounds: Normal heart sounds. No murmur heard.    No friction rub. No gallop.  Pulmonary:     Effort: Pulmonary effort is normal.     Breath sounds: Normal breath sounds. No wheezing, rhonchi or rales.  Abdominal:     General: Abdomen is flat.     Palpations: Abdomen is soft.     Tenderness: There is no abdominal tenderness. There is no guarding or rebound.  Skin:    General: Skin is warm and dry.     Capillary Refill: Capillary refill takes less than 2 seconds.     Findings: No rash.  Neurological:     General: No focal deficit present.     Mental Status: She is alert and oriented to person, place, and time.      UC Treatments / Results  Labs (all labs ordered are listed, but only abnormal results are displayed) Labs Reviewed  SARS CORONAVIRUS 2 BY RT PCR    EKG   Radiology No results found.  Procedures Procedures (including critical care time)  Medications Ordered in UC Medications - No data to display  Initial Impression / Assessment and Plan / UC Course  I have reviewed the triage vital signs and the nursing notes.  Pertinent labs & imaging results that were available during my care of the patient were reviewed by me and considered in my medical decision making (see chart for details).   Patient is a nontoxic-appearing 42 year old female presenting for evaluation of GI symptoms as outlined in HPI above.  She reports that they have been waxing waning over the last 2 days and she denies any nausea at present though she did have diarrhea this morning.  She denies any blood in her stools.  Her physical exam reveals an abdomen is soft,  flat, and nontender.  She denies any upper respiratory symptoms.  Given that she was recently in the hospital I will order a COVID PCR to determine if COVID is the cause of her GI symptoms.  If her COVID is negative she most likely has a viral GI illness and I will treat her symptomatically.  COVID PCR is negative.  I will discharge her home with a diagnosis of viral gastroenteritis with prescription for Zofran and dicyclomine help with abdominal cramping and nausea.  Progression of diet discussed as well as return precautions and ER precautions.   Final Clinical Impressions(s) / UC Diagnoses   Final diagnoses:  Noninfectious gastroenteritis, unspecified type     Discharge Instructions      Take the Zofran every 8 hours as needed for nausea and vomiting.  They are an oral disintegrating tablet and you can place them on her under your tongue and then will be absorbed.  Use the Bentyl (dicyclomine) every 6 hours as needed for abdominal cramping.  Follow a clear liquid diet for the next 6 to 12 hours.  Clear liquids consist of broth, ginger ale, water, Pedialyte, and Jell-O.  After 6 to 12 hours, if you are tolerating clear liquids, you can advance to bland foods such as bananas, rice, applesauce, and toast.  If you tolerate bland foods  you can continue to advance your diet as you see fit.  If you develop a fever over 100.5, increased abdominal pain, bloody vomit, or bloody stool return for reevaluation or go to the ER.      ED Prescriptions     Medication Sig Dispense Auth. Provider   ondansetron (ZOFRAN-ODT) 8 MG disintegrating tablet Take 1 tablet (8 mg total) by mouth every 8 (eight) hours as needed for nausea or vomiting. 20 tablet Becky Augusta, NP   dicyclomine (BENTYL) 20 MG tablet Take 1 tablet (20 mg total) by mouth 2 (two) times daily. 20 tablet Becky Augusta, NP      PDMP not reviewed this encounter.   Becky Augusta, NP 04/06/23 [phone]

## 2023-04-06 NOTE — ED Triage Notes (Signed)
Pt c/o abd pain,nausea,diarrhea & fatigue x2 days. Has tried immodium w/o relief.

## 2023-04-06 NOTE — Discharge Instructions (Addendum)
Take the Zofran every 8 hours as needed for nausea and vomiting.  They are an oral disintegrating tablet and you can place them on her under your tongue and then will be absorbed.  Use the Bentyl (dicyclomine) every 6 hours as needed for abdominal cramping.  Follow a clear liquid diet for the next 6 to 12 hours.  Clear liquids consist of broth, ginger ale, water, Pedialyte, and Jell-O.  After 6 to 12 hours, if you are tolerating clear liquids, you can advance to bland foods such as bananas, rice, applesauce, and toast.  If you tolerate bland foods you can continue to advance your diet as you see fit.  If you develop a fever over 100.5, increased abdominal pain, bloody vomit, or bloody stool return for reevaluation or go to the ER.  

## 2023-09-07 ENCOUNTER — Ambulatory Visit
Admission: EM | Admit: 2023-09-07 | Discharge: 2023-09-07 | Disposition: A | Discharge status: Home / Self Care | Payer: Managed Care, Other (non HMO) | Attending: Emergency Medicine | Admitting: Emergency Medicine

## 2023-09-07 DIAGNOSIS — M5441 Lumbago with sciatica, right side: Secondary | ICD-10-CM

## 2023-09-07 MED ORDER — BACLOFEN 10 MG PO TABS: 10.0000 mg | ORAL_TABLET | Freq: Three times a day (TID) | ORAL | 0 refills | Status: AC

## 2023-09-07 MED ORDER — IBUPROFEN 600 MG PO TABS: 600.0000 mg | ORAL_TABLET | Freq: Four times a day (QID) | ORAL | 0 refills | Status: AC | PRN

## 2023-09-07 MED ORDER — KETOROLAC TROMETHAMINE 30 MG/ML IJ SOLN
30.0000 mg | Freq: Once | INTRAMUSCULAR | Status: AC
  Administered 2023-09-07: 30 mg via INTRAMUSCULAR

## 2023-09-07 NOTE — ED Triage Notes (Signed)
 Pt states that she was recently in a 11 hour car ride on Saturday without major breaks.  Pt is worried about her appendix  Pt denies any pain or tenderness  Pt states that she has increased gas.  Pt last bowel movement was within the last hour and pt states that it was complete and was normal in color and was solid.  Pt c/o lower right back and hip pain going into the buttock x5days  Pt denies any new physical activity or falls

## 2023-09-07 NOTE — ED Provider Notes (Signed)
 [facility]    CSN: [id number] Arrival date & time: 09/07/23  0815      History   Chief Complaint Chief Complaint  Patient presents with   Back Pain   Hip Pain         HPI Kimberly Campbell is a 43 y.o. female.   HPI  43 year old female with a past medical history significant for uterine fibroids, OSA, and headaches presents for evaluation of right-sided low back pain that radiates into her right buttock x 5 days.  She reports that the onset of the pain she had an 11-hour car ride without any breaks.  The pain does increase with movement, is constantly dull with occasional sharp increases, and she rates it a 10/10.  She took 500 mg of Tylenol  this morning without any improvement.  She denies any previous back injury, urinary symptoms, fever, nausea, vomiting, or abdominal pain.  Past Medical History:  Diagnosis Date   Decreased fetal movement in pregnancy in third trimester, antepartum 02/10/2015   Gestational diabetes    Headache(784.0)    last one - 05/2011   Missed abortion 12/2011   no surgery required   No pertinent past medical history    Uterine fibroids affecting pregnancy     Patient Active Problem List   Diagnosis Date Noted   OSA (obstructive sleep apnea) 09/24/2017   Pregnancy 02/22/2015   Pregnancy w/ hx of uterine myomectomy 02/19/2015   Previous cesarean section 02/19/2015   GDM, class A2-insulin  02/19/2015   Thrombocytopenia affecting pregnancy, antepartum (HCC) 02/19/2015   Postpartum care following cesarean delivery (6/16) 02/19/2015   Decreased fetal movement in pregnancy in third trimester, antepartum 02/10/2015    Past Surgical History:  Procedure Laterality Date   CESAREAN SECTION     CESAREAN SECTION N/A 01/27/2014   Procedure: CESAREAN SECTION;  Surgeon: Burnard A. Kandyce, MD;  Location: [facility];  Service: Obstetrics;  Laterality: N/A;   CESAREAN SECTION N/A 02/19/2015   Procedure: Repeat CESAREAN SECTION;  Surgeon: Charlie Flowers, MD;  Location: [facility];  Service: Obstetrics;  Laterality: N/A;  EDD: 03/16/15   LEEP     MYOMECTOMY     ROBOT ASSISTED MYOMECTOMY  04/17/2012   Procedure: ROBOTIC ASSISTED MYOMECTOMY;  Surgeon: Marie-Lyne Lavoie, MD;  Location: [facility];  Service: Gynecology;  Laterality: N/A;   WISDOM TOOTH EXTRACTION      OB History     Gravida  6   Para  2   Term  1   Preterm  1   AB  1   Living  2      SAB  1   IAB  0   Ectopic  0   Multiple      Live Births  2            Home Medications    Prior to Admission medications   Medication Sig Start Date End Date Taking? Authorizing Provider  baclofen  (LIORESAL ) 10 MG tablet Take 1 tablet (10 mg total) by mouth 3 (three) times daily. 09/07/23  Yes Bernardino Ditch, NP  ibuprofen  (ADVIL ) 600 MG tablet Take 1 tablet (600 mg total) by mouth every 6 (six) hours as needed. 09/07/23  Yes Bernardino Ditch, NP  gabapentin  (NEURONTIN ) 300 MG capsule Take 1 capsule (300 mg total) by mouth at bedtime. 07/26/20  Yes Cook, Jayce G, DO  meloxicam  (MOBIC ) 15 MG tablet Take 1 tablet (15 mg total) by mouth daily. 06/10/22  Yes Janit Thresa HERO, DPM  MOUNJARO 10 MG/0.5ML Pen SMARTSIG:10 Milligram(s) SUB-Q Once a Week   Yes [provider]  pravastatin (PRAVACHOL) 20 MG tablet Take 1 tablet by mouth at bedtime. 01/17/23 01/17/24 Yes [provider]  cetirizine (ZYRTEC) 10 MG tablet Take by mouth.  07/26/20  [provider]    Family History Family History  Problem Relation Age of Onset   Hypertension Mother    Diabetes Father    Cancer Father    Breast cancer Maternal Aunt     Social History Social History   Tobacco Use   Smoking status: Never   Smokeless tobacco: Never  Vaping Use   Vaping status: Never Used  Substance Use Topics   Alcohol use: Not Currently    Comment: rarely   Drug use: No     Allergies   Sulfa antibiotics   Review of Systems Review of Systems  Constitutional:  Negative for fever.   Gastrointestinal:  Negative for abdominal pain.  Genitourinary:  Negative for dysuria, frequency and urgency.  Musculoskeletal:  Positive for back pain.     Physical Exam Triage Vital Signs ED Triage Vitals  Encounter Vitals Group     BP      Systolic BP Percentile      Diastolic BP Percentile      Pulse      Resp      Temp      Temp src      SpO2      Weight      Height      Head Circumference      Peak Flow      Pain Score      Pain Loc      Pain Education      Exclude from Growth Chart    No data found.  Updated Vital Signs BP 134/86 (BP Location: Left Arm)   Pulse 89   Temp 97.9 F (36.6 C) (Oral)   Ht 5' 7 (1.702 m)   Wt 223 lb (101.2 kg)   LMP 08/31/2023   SpO2 97%   BMI 34.93 kg/m   Visual Acuity Right Eye Distance:   Left Eye Distance:   Bilateral Distance:    Right Eye Near:   Left Eye Near:    Bilateral Near:     Physical Exam Vitals and nursing note reviewed.  Constitutional:      Appearance: Normal appearance.  Musculoskeletal:        General: No tenderness or signs of injury.  Skin:    General: Skin is warm and dry.     Capillary Refill: Capillary refill takes less than 2 seconds.  Neurological:     General: No focal deficit present.     Mental Status: She is alert and oriented to person, place, and time.      UC Treatments / Results  Labs (all labs ordered are listed, but only abnormal results are displayed) Labs Reviewed - No data to display  EKG   Radiology No results found.  Procedures Procedures (including critical care time)  Medications Ordered in UC Medications  ketorolac  (TORADOL ) 30 MG/ML injection 30 mg (has no administration in time range)    Initial Impression / Assessment and Plan / UC Course  I have reviewed the triage vital signs and the nursing notes.  Pertinent labs & imaging results that were available during my care of the patient were reviewed by me and considered in my medical decision making  (see chart for details).  Very pleasant, nontoxic 43 year old female today for evaluation of 5 days worth of right-sided low back pain that radiates into her right hip.  The patient reports that she is concerned about appendicitis though she denies any abdominal pain, nausea or vomiting, or fever.  Also no urinary symptoms.  Patient's exam is more consistent with sciatica as she reports that when she went to drive for the shower this morning and raised her left leg she had pain in her right sided low back.  She also is a positive straight leg raise on the right in exam room.  No midline spinous process tenderness or step-off and no tenderness of the paraspinous muscles bilaterally.  Also no pain with palpation along the path of the sciatic nerve.  Given that her pain increases with movement and she has a positive straight leg raise am suspicious for sciatica and I will treat her as such with 30 mg of IM Toradol  in clinic followed by at home 600 mg ibuprofen  every 6 hours on a schedule for 48 hours along with baclofen  10 mg every 8 hours on a schedule for 48 hours.  Following the footer.  She can go to an as-needed basis.  Home physical therapy exercises also discussed.  Return precautions reviewed.   Final Clinical Impressions(s) / UC Diagnoses   Final diagnoses:  Acute right-sided low back pain with right-sided sciatica     Discharge Instructions      Take the ibuprofen , 600 mg every 6 hours with food, on a schedule for the next 48 hours and then as needed.  Take the baclofen , 10 mg every 8 hours, on a schedule for the next 48 hours and then as needed.  Apply moist heat to your back for 30 minutes at a time 2-3 times a day to improve blood flow to the area and help remove the lactic acid causing the spasm.  Follow the back exercises given at discharge.  Return for reevaluation for any new or worsening symptoms.      ED Prescriptions     Medication Sig Dispense Auth. Provider    ibuprofen  (ADVIL ) 600 MG tablet Take 1 tablet (600 mg total) by mouth every 6 (six) hours as needed. 30 tablet Bernardino Ditch, NP   baclofen  (LIORESAL ) 10 MG tablet Take 1 tablet (10 mg total) by mouth 3 (three) times daily. 30 each Bernardino Ditch, NP      PDMP not reviewed this encounter.   Bernardino Ditch, NP 09/07/23 [phone]

## 2023-09-07 NOTE — Discharge Instructions (Addendum)

## 2023-10-12 IMAGING — MG MM DIGITAL SCREENING BILAT W/ TOMO AND CAD
8 series · 8 of 24 positions shown · non-contrast
Comparison: Previous exam(s).

CLINICAL DATA: Screening.

EXAM:
DIGITAL SCREENING BILATERAL MAMMOGRAM WITH TOMOSYNTHESIS AND CAD
TECHNIQUE: Bilateral screening digital craniocaudal and mediolateral oblique
mammograms were obtained. Bilateral screening digital breast
tomosynthesis was performed. The images were evaluated with
computer-aided detection.

[L MLO synth-2D]
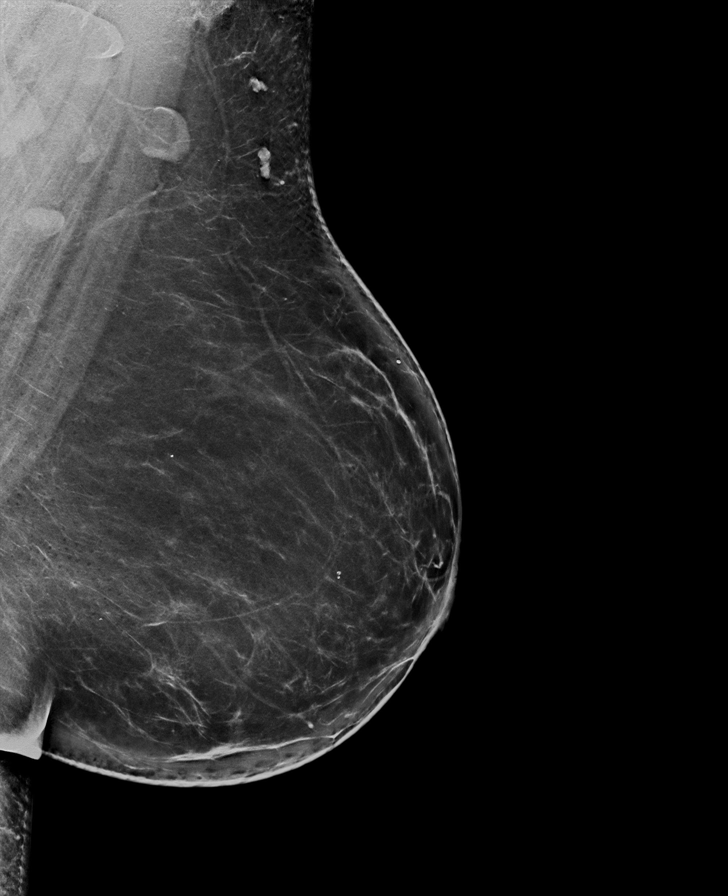

[R MLO synth-2D]
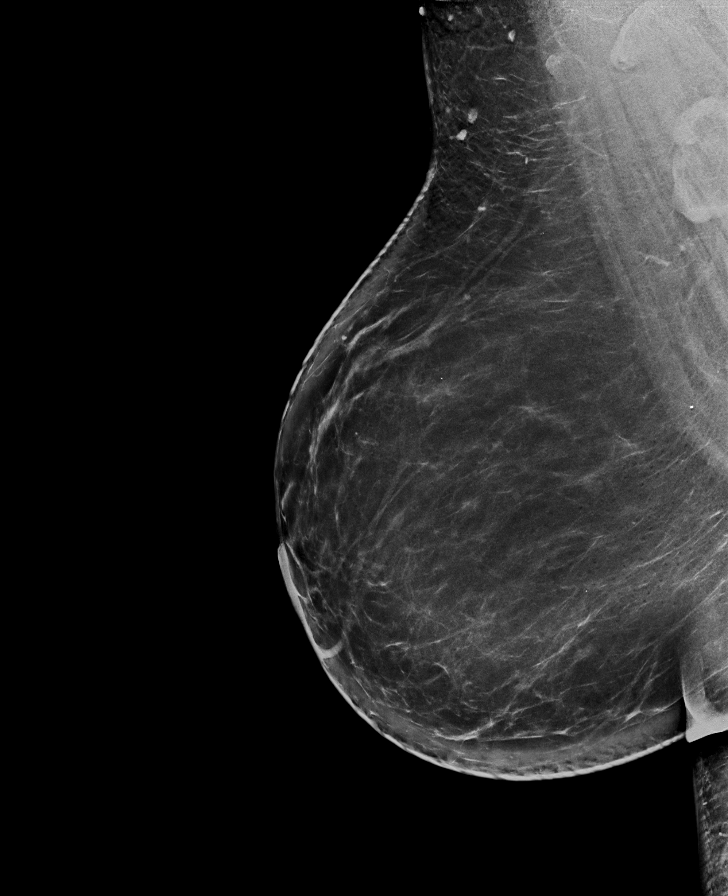

[L CC synth-2D]
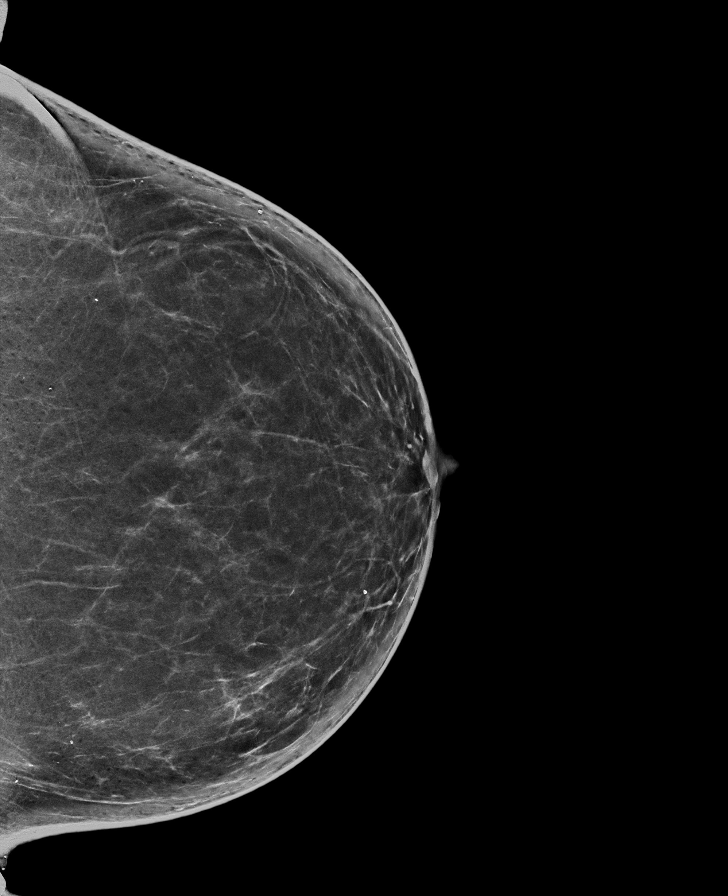

[R CC synth-2D]
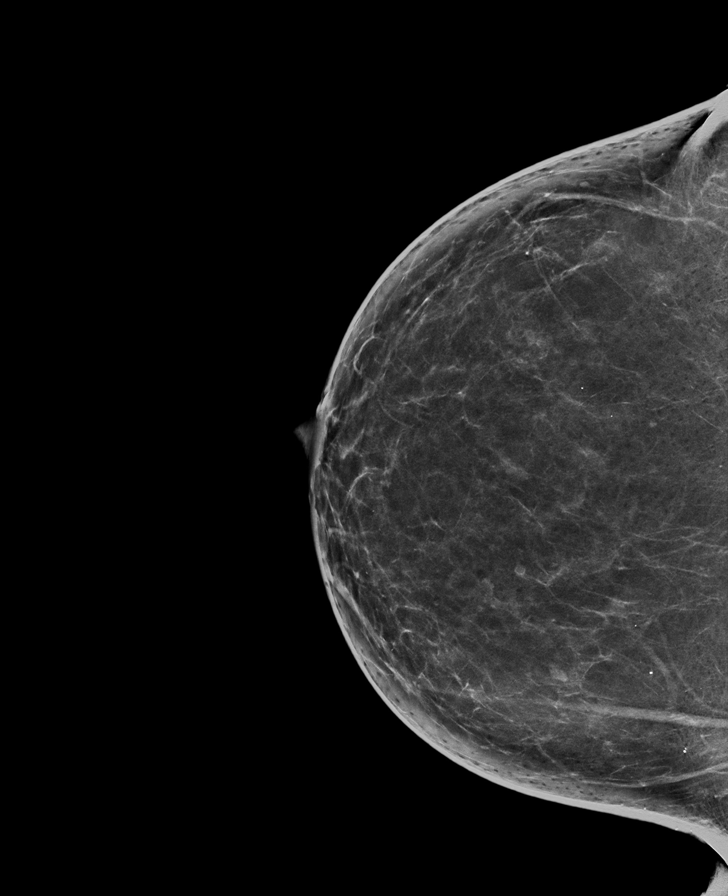

[R CC tomo · tomo slice 42/83.0]
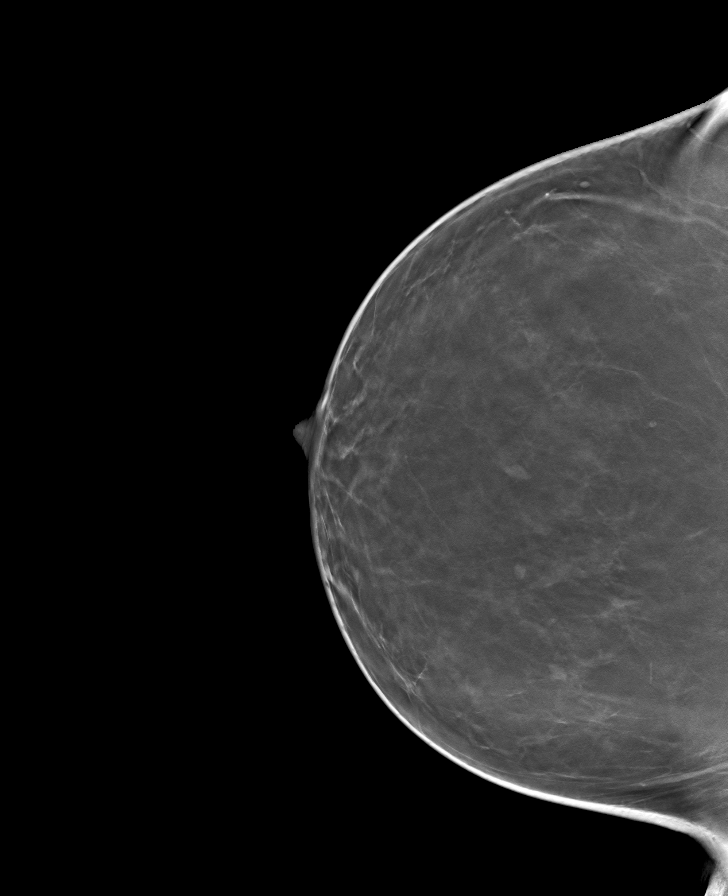

[L CC tomo · tomo slice 42/83.0]
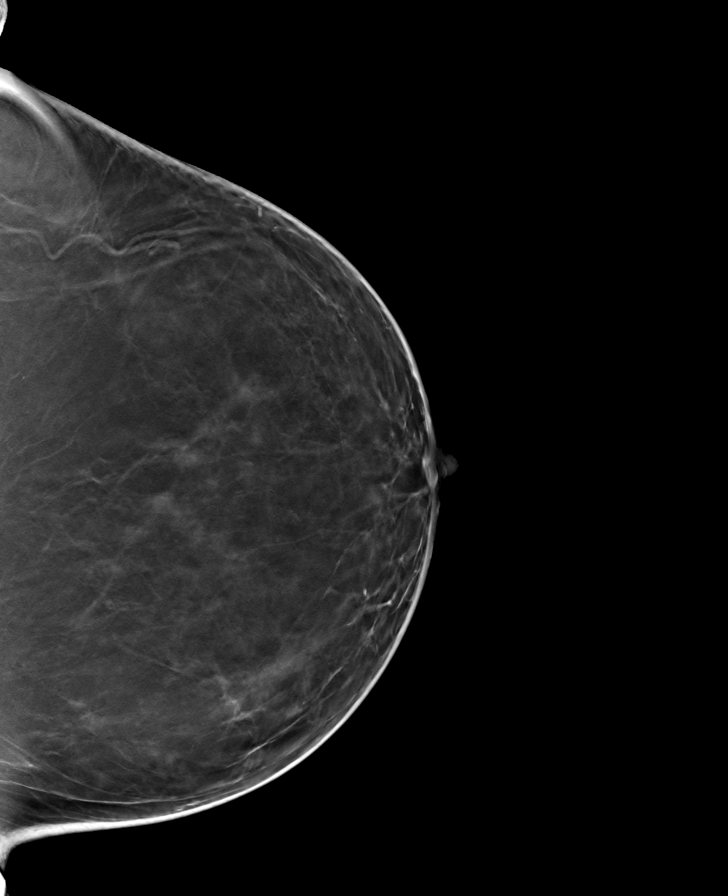

[R MLO tomo · tomo slice 51/102.0]
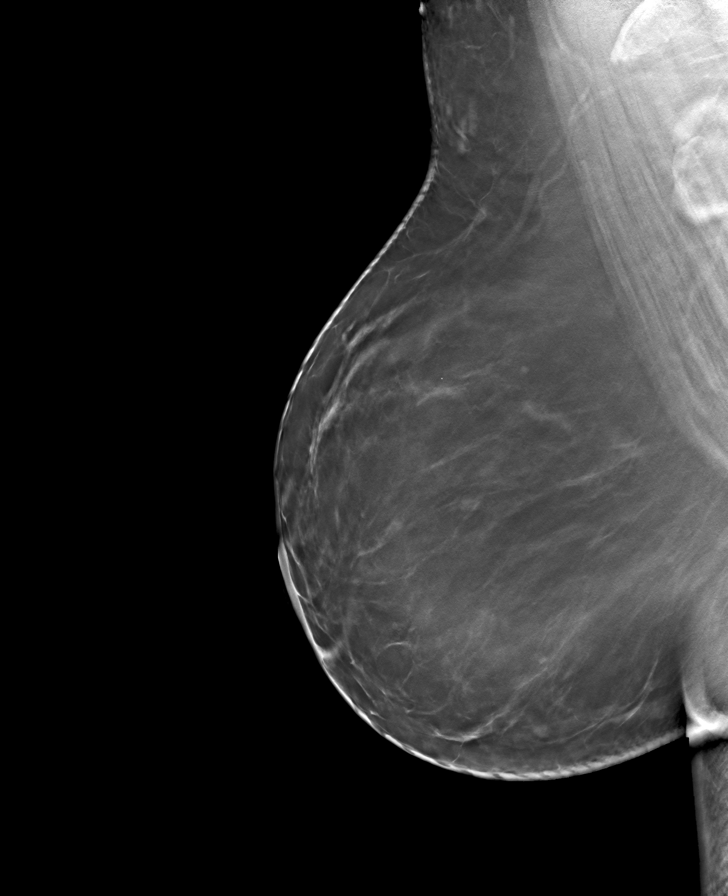

[L MLO tomo · tomo slice 51/102.0]
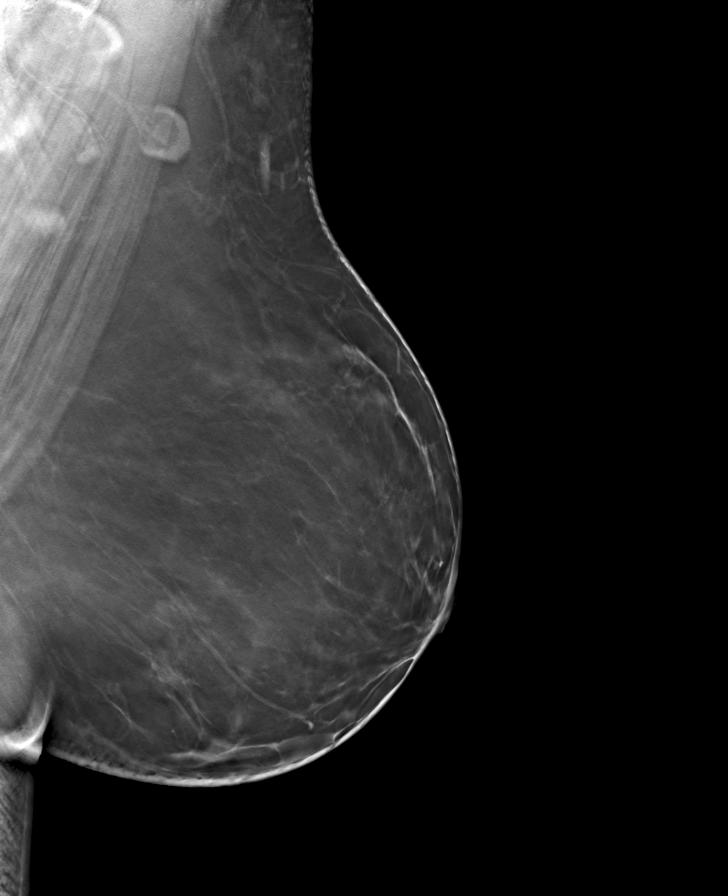

[8 of 24 positions shown; findings below may reference images not displayed]

ACR Breast Density Category b: There are scattered areas of
fibroglandular density.
FINDINGS: There are no findings suspicious for malignancy.
IMPRESSION: No mammographic evidence of malignancy. A result letter of this
screening mammogram will be mailed directly to the patient.

RECOMMENDATION:
Screening mammogram in one year. (Code:51-O-LD2)

BI-RADS CATEGORY  1: Negative.

## 2024-01-09 ENCOUNTER — Other Ambulatory Visit: Payer: Self-pay | Admitting: Family Medicine

## 2024-01-09 DIAGNOSIS — Z1231 Encounter for screening mammogram for malignant neoplasm of breast: Secondary | ICD-10-CM

## 2024-02-27 ENCOUNTER — Ambulatory Visit
Admission: RE | Admit: 2024-02-27 | Discharge: 2024-02-27 | Disposition: A | Discharge status: Home / Self Care | Source: Ambulatory Visit | Attending: Family Medicine | Admitting: Family Medicine

## 2024-02-27 DIAGNOSIS — Z1231 Encounter for screening mammogram for malignant neoplasm of breast: Secondary | ICD-10-CM | POA: Diagnosis present
